# Patient Record
Sex: Female | Born: 1965 | Race: Black or African American | Hispanic: No | Marital: Married | Smoking: Former smoker
Health system: Southern US, Community
[De-identification: ages and names within clinical notes are randomized; demographics above are authoritative.]

## PROBLEM LIST (undated history)

## (undated) DIAGNOSIS — T7840XA Allergy, unspecified, initial encounter: Secondary | ICD-10-CM

## (undated) DIAGNOSIS — R011 Cardiac murmur, unspecified: Secondary | ICD-10-CM

## (undated) DIAGNOSIS — M1990 Unspecified osteoarthritis, unspecified site: Secondary | ICD-10-CM

## (undated) DIAGNOSIS — J45909 Unspecified asthma, uncomplicated: Secondary | ICD-10-CM

## (undated) DIAGNOSIS — F419 Anxiety disorder, unspecified: Secondary | ICD-10-CM

## (undated) DIAGNOSIS — F329 Major depressive disorder, single episode, unspecified: Secondary | ICD-10-CM

## (undated) DIAGNOSIS — Z8489 Family history of other specified conditions: Secondary | ICD-10-CM

## (undated) DIAGNOSIS — F32A Depression, unspecified: Secondary | ICD-10-CM

## (undated) DIAGNOSIS — I341 Nonrheumatic mitral (valve) prolapse: Secondary | ICD-10-CM

## (undated) DIAGNOSIS — R002 Palpitations: Secondary | ICD-10-CM

## (undated) DIAGNOSIS — K219 Gastro-esophageal reflux disease without esophagitis: Secondary | ICD-10-CM

## (undated) HISTORY — DX: Unspecified asthma, uncomplicated: J45.909

## (undated) HISTORY — DX: Cardiac murmur, unspecified: R01.1

## (undated) HISTORY — DX: Palpitations: R00.2

## (undated) HISTORY — DX: Anxiety disorder, unspecified: F41.9

## (undated) HISTORY — DX: Gastro-esophageal reflux disease without esophagitis: K21.9

## (undated) HISTORY — DX: Depression, unspecified: F32.A

## (undated) HISTORY — PX: ABDOMINAL HYSTERECTOMY: SHX81

## (undated) HISTORY — DX: Major depressive disorder, single episode, unspecified: F32.9

## (undated) HISTORY — DX: Nonrheumatic mitral (valve) prolapse: I34.1

## (undated) HISTORY — PX: PILONIDAL CYST EXCISION: SHX744

## (undated) HISTORY — DX: Unspecified osteoarthritis, unspecified site: M19.90

## (undated) HISTORY — DX: Allergy, unspecified, initial encounter: T78.40XA

## (undated) HISTORY — PX: OTHER SURGICAL HISTORY: SHX169

---

## 2016-05-01 ENCOUNTER — Emergency Department (HOSPITAL_COMMUNITY): Payer: BC Managed Care – PPO

## 2016-05-01 ENCOUNTER — Encounter (HOSPITAL_COMMUNITY): Payer: Self-pay | Admitting: Emergency Medicine

## 2016-05-01 ENCOUNTER — Emergency Department (HOSPITAL_COMMUNITY)
Admission: EM | Admit: 2016-05-01 | Discharge: 2016-05-01 | Disposition: A | Discharge status: Home / Self Care | Payer: BC Managed Care – PPO | Attending: Emergency Medicine | Admitting: Emergency Medicine

## 2016-05-01 DIAGNOSIS — Y999 Unspecified external cause status: Secondary | ICD-10-CM | POA: Insufficient documentation

## 2016-05-01 DIAGNOSIS — S0081XA Abrasion of other part of head, initial encounter: Secondary | ICD-10-CM | POA: Diagnosis not present

## 2016-05-01 DIAGNOSIS — S0990XA Unspecified injury of head, initial encounter: Secondary | ICD-10-CM | POA: Diagnosis present

## 2016-05-01 DIAGNOSIS — Z87891 Personal history of nicotine dependence: Secondary | ICD-10-CM | POA: Insufficient documentation

## 2016-05-01 DIAGNOSIS — W01198A Fall on same level from slipping, tripping and stumbling with subsequent striking against other object, initial encounter: Secondary | ICD-10-CM | POA: Insufficient documentation

## 2016-05-01 DIAGNOSIS — S40212A Abrasion of left shoulder, initial encounter: Secondary | ICD-10-CM | POA: Diagnosis not present

## 2016-05-01 DIAGNOSIS — S060X0A Concussion without loss of consciousness, initial encounter: Secondary | ICD-10-CM | POA: Insufficient documentation

## 2016-05-01 DIAGNOSIS — Y939 Activity, unspecified: Secondary | ICD-10-CM | POA: Insufficient documentation

## 2016-05-01 DIAGNOSIS — Z79899 Other long term (current) drug therapy: Secondary | ICD-10-CM | POA: Diagnosis not present

## 2016-05-01 DIAGNOSIS — Y92834 Zoological garden (Zoo) as the place of occurrence of the external cause: Secondary | ICD-10-CM | POA: Insufficient documentation

## 2016-05-01 NOTE — Discharge Instructions (Signed)
Your CT head does not show serious head injury. Your symptoms are suggestive of concussion. Take ibuprofen and tylenol for pain. Follow-up with PCP closely.  Rest your brain for concussive symptoms, such as avoiding prolonged computer/iphone/TV usage or activities requiring a significant amount of concentration until symptoms improve.

## 2016-05-01 NOTE — ED Provider Notes (Signed)
[facility] DEPT Provider Note   CSN: [id number] Arrival date & time: 05/01/16  1019     History   Chief Complaint Chief Complaint  Patient presents with  . Fall  . Head Injury    HPI Avayah Raffety is a 51 y.o. female.  The history is provided by the patient.  Fall  This is a new problem. The current episode started yesterday. Episode frequency: just once. The problem has been gradually worsening. Associated symptoms include headaches. Pertinent negatives include no chest pain, no abdominal pain and no shortness of breath. Nothing aggravates the symptoms. Nothing relieves the symptoms. She has tried ASA for the symptoms. The treatment provided no relief.   51 year old female who presents after mechanical fall with head injury. She is otherwise healthy and not anticoagulated. States that she tripped and fell onto the left side of her [place]. She did not have loss of consciousness or headache immediately after her injuries and felt okay. He did sustain multiple abrasions to the left shoulder and extremities for which she took ibuprofen for, to good effect. States that she was up all night because she was nervous about potential head injury. She developed right-sided headache and tenderness over the left side of her scalp starting at 3:57 AM in the morning. States that throughout this morning has been feeling "foggy" and nauseous. Denies any vision or speech changes, confusion, difficulty walking, focal numbness or weakness, or dizziness. Complains of musculoskeletal soreness throughout her body from her fall, but has been ambulating and moving everything normally. No chest pain, difficulty breathing, abdominal pain, nausea or vomiting. History reviewed. No pertinent past medical history.  There are no active problems to display for this patient.   Past Surgical History:  Procedure Laterality Date  . ABDOMINAL HYSTERECTOMY    . left  oopharectomy      OB History    No data available       Home Medications    Prior to Admission medications   Medication Sig Start Date End Date Taking? Authorizing Provider  loratadine (CLARITIN) 10 MG tablet Take 10 mg by mouth daily as needed for allergies.    Yes Historical Provider, MD  metoprolol succinate (TOPROL-XL) 25 MG 24 hr tablet Take 25 mg by mouth at bedtime. 03/09/16  Yes Historical Provider, MD  sertraline (ZOLOFT) 25 MG tablet Take 25 mg by mouth at bedtime.   Yes Historical Provider, MD    Family History History reviewed. No pertinent family history.  Social History Social History  Substance Use Topics  . Smoking status: Former Research scientist (life sciences)  . Smokeless tobacco: Not on file  . Alcohol use Yes     Comment: occasional     Allergies   Patient has no known allergies.   Review of Systems Review of Systems  Constitutional: Negative for appetite change and fever.  Respiratory: Negative for shortness of breath.   Cardiovascular: Negative for chest pain.  Gastrointestinal: Negative for abdominal pain.  Genitourinary: Negative for difficulty urinating.  Skin: Positive for wound.  Allergic/Immunologic: Negative for immunocompromised state.  Neurological: Positive for headaches.  Hematological: Does not bruise/bleed easily.  Psychiatric/Behavioral: Negative for confusion.  All other systems reviewed and are negative.    Physical Exam Updated Vital Signs BP (!) 140/97 (BP Location: Left Arm)   Pulse 78   Temp 98 F (36.7 C) (Oral)   Resp 18   Physical Exam Physical Exam  Nursing note and vitals reviewed. Constitutional:  Well developed, well nourished, non-toxic, and in no acute distress Head: Normocephalic and atraumatic.  Mouth/Throat: Oropharynx is clear and moist.  Ears: normal bilateral TMs Neck: Normal range of motion. Neck supple. no midline cervical spine tenderness Cardiovascular: Normal rate and regular rhythm.   Pulmonary/Chest: Effort  normal and breath sounds normal.  Abdominal: Soft. There is no tenderness. There is no rebound and no guarding.  Musculoskeletal: Normal range of motion.  Skin: Skin is warm and dry. abrasions over left chin, left shoulder, lower extremities Psychiatric: Cooperative Neurological:  Alert, oriented to person, place, time, and situation. Memory grossly in tact. Fluent speech. No dysarthria or aphasia.  Cranial nerves: VF are full.  EOMI without nystagmus. No gaze deviation. Facial muscles symmetric with activation. Sensation to light touch over face in tact bilaterally. Hearing grossly in tact. Palate elevates symmetrically. Head turn and shoulder shrug are intact. Tongue midline.  Reflexes defered.  Muscle bulk and tone normal. No pronator drift. Moves all extremities symmetrically. Sensation to light touch is in tact throughout in bilateral upper and lower extremities. Coordination reveals no dysmetria with finger to nose. Gait is narrow-based and steady. Non-ataxic.    ED Treatments / Results  Labs (all labs ordered are listed, but only abnormal results are displayed) Labs Reviewed - No data to display  EKG  EKG Interpretation None       Radiology Ct Head Wo Contrast  Result Date: 05/01/2016 CLINICAL DATA:  Tripped and fell yesterday, struck head on concrete, no LOC, head pain, nausea today EXAM: CT HEAD WITHOUT CONTRAST TECHNIQUE: Contiguous axial images were obtained from the base of the skull through the vertex without intravenous contrast. COMPARISON:  None. FINDINGS: Brain: No intracranial hemorrhage, mass effect or midline shift. No acute cortical infarction. No hydrocephalus. No mass lesion is noted on this unenhanced scan. The gray and white-matter differentiation is preserved. Vascular: No hyperdense vessel or unexpected calcification. Skull: Normal. Negative for fracture or focal lesion. Sinuses/Orbits: No acute finding. Other: None. IMPRESSION: No acute intracranial  abnormality. No skull fracture is noted. No paranasal sinuses air-fluid levels. Electronically Signed   By: Lahoma Crocker M.D.   On: 05/01/2016 11:34    Procedures Procedures (including critical care time)  Medications Ordered in ED Medications - No data to display   Initial Impression / Assessment and Plan / ED Course  I have reviewed the triage vital signs and the nursing notes.  Pertinent labs & imaging results that were available during my care of the patient were reviewed by me and considered in my medical decision making (see chart for details).     Presents with persistent headache, nausea and foggy thinking after head injury yesterday. Neuro intact. Suspect concussive head injury. CT had visualized and shows no acute intracranial processes. Discussed supportive care management and close PCP follow-up. Strict return and follow-up instructions reviewed. She expressed understanding of all discharge instructions and felt comfortable with the plan of care.   Final Clinical Impressions(s) / ED Diagnoses   Final diagnoses:  Injury of head, initial encounter  Concussion without loss of consciousness, initial encounter    New Prescriptions New Prescriptions   No medications on file     Forde Dandy, MD 05/01/16 1200

## 2016-05-01 NOTE — ED Triage Notes (Signed)
Pt tripped and fell and struck left head and shoulder on concrete yesterday. No LOC. C/o sharp right head pain, left and posterior head throbbing, disorientation onset about 30 minutes after injury, nausea onset today. Pt stayed up for most of night.  No vision changes, dizziness, slurred speech, ataxia, weakness.

## 2016-12-31 ENCOUNTER — Other Ambulatory Visit: Payer: Self-pay | Admitting: Internal Medicine

## 2016-12-31 DIAGNOSIS — R2242 Localized swelling, mass and lump, left lower limb: Secondary | ICD-10-CM

## 2017-01-14 ENCOUNTER — Ambulatory Visit: Payer: BC Managed Care – PPO | Admitting: Neurology

## 2017-01-14 ENCOUNTER — Encounter: Payer: Self-pay | Admitting: Neurology

## 2017-01-14 ENCOUNTER — Encounter (INDEPENDENT_AMBULATORY_CARE_PROVIDER_SITE_OTHER): Payer: Self-pay

## 2017-01-14 VITALS — BP 132/88 | HR 87 | Ht 64.0 in | Wt 179.0 lb

## 2017-01-14 DIAGNOSIS — R519 Headache, unspecified: Secondary | ICD-10-CM

## 2017-01-14 DIAGNOSIS — R0683 Snoring: Secondary | ICD-10-CM | POA: Diagnosis not present

## 2017-01-14 DIAGNOSIS — G4719 Other hypersomnia: Secondary | ICD-10-CM

## 2017-01-14 DIAGNOSIS — G478 Other sleep disorders: Secondary | ICD-10-CM

## 2017-01-14 DIAGNOSIS — I341 Nonrheumatic mitral (valve) prolapse: Secondary | ICD-10-CM

## 2017-01-14 DIAGNOSIS — R51 Headache: Secondary | ICD-10-CM

## 2017-01-14 DIAGNOSIS — R002 Palpitations: Secondary | ICD-10-CM | POA: Diagnosis not present

## 2017-01-14 DIAGNOSIS — I493 Ventricular premature depolarization: Secondary | ICD-10-CM

## 2017-01-14 NOTE — Progress Notes (Signed)
Subjective:    Patient ID: Rachel Stokes is a 52 y.o. female.  HPI     Star Age, MD, PhD [facility] [address] [place], [place] [address]  Dear Ulice Dash,   I saw your patient, Rachel Stokes, upon your kind request in my neurologic clinic today for initial consultation of her sleep disorder, in particular, concern for underlying obstructive sleep apnea. The patient is unaccompanied today. As you know, Rachel Stokes is a 52 year old left-handed woman with an underlying medical history of asthma, palpitations, mitral valve prolapse, PVCs, allergies, depression, and obesity, who reports snoring and excessive daytime somnolence. I reviewed your office note from 11/27/2016, which you kindly included. Her Epworth sleepiness score is 10 out of 24 today, fatigue score is 42 out of 63. She does not wake up rested. She is married and lives with her husband. She quit smoking in 2000, but was an infrequent/social smoker only. She drinks alcohol occasionally, caffeine infrequently, maybe once or twice a week. She has always been a night owl, her children tend to be like that as well. Her bedtime is around 11:30 but sometimes she is not asleep until 1 or 2 AM. Sometimes she sleeps better on the couch then in her bedroom. She does not have night to night nocturia but has had morning headaches. Her husband has sleep apnea and uses a CPAP machine. She denies telltale symptoms of restless leg syndrome. She is not aware of any family history of OSA. Wakeup time is between 8 and 9. She is a Land at [employer]. She has a 15 year old son and 30 year old daughter. Her daughter is home schooled.   Her Past Medical History Is Significant For: Past Medical History:  Diagnosis Date  . MVP (mitral valve prolapse)   . Palpitations     Her Past Surgical History Is Significant For: Past Surgical History:   Procedure Laterality Date  . ABDOMINAL HYSTERECTOMY    . left oopharectomy      Her Family History Is Significant For: No family history on file.  Her Social History Is Significant For: Social History   Socioeconomic History  . Marital status: Married    Spouse name: None  . Number of children: None  . Years of education: None  . Highest education level: None  Social Needs  . Financial resource strain: None  . Food insecurity - worry: None  . Food insecurity - inability: None  . Transportation needs - medical: None  . Transportation needs - non-medical: None  Occupational History  . None  Tobacco Use  . Smoking status: Former Research scientist (life sciences)  . Smokeless tobacco: Never Used  Substance and Sexual Activity  . Alcohol use: Yes    Comment: occasional  . Drug use: No  . Sexual activity: None  Other Topics Concern  . None  Social History Narrative  . None    Her Allergies Are:  No Known Allergies:   Her Current Medications Are:  Outpatient Encounter Medications as of 01/14/2017  Medication Sig  . loratadine (CLARITIN) 10 MG tablet Take 10 mg by mouth daily as needed for allergies.   . metoprolol succinate (TOPROL-XL) 25 MG 24 hr tablet Take 25 mg by mouth at bedtime.  . sertraline (ZOLOFT) 50 MG tablet Take 50 mg by mouth at bedtime.  . [DISCONTINUED] sertraline (ZOLOFT) 25 MG tablet Take 25 mg by mouth at bedtime.   No facility-administered encounter medications on file as of  01/14/2017.   :  Review of Systems:  Out of a complete 14 point review of systems, all are reviewed and negative with the exception of these symptoms as listed below: Review of Systems  Neurological:       Pt presents today to discuss her sleep. Pt has never had a sleep study but does endorse snoring.  Epworth Sleepiness Scale 0= would never doze 1= slight chance of dozing 2= moderate chance of dozing 3= high chance of dozing  Sitting and reading: 1 Watching TV: 3 Sitting inactive in a public  place (ex. Theater or meeting): 1 As a passenger in a car for an hour without a break: 3 Lying down to rest in the afternoon: 1 Sitting and talking to someone: 0 Sitting quietly after lunch (no alcohol): 1 In a car, while stopped in traffic: 0 Total: 10     Objective:  Neurological Exam  Physical Exam Physical Examination:   Vitals:   01/14/17 1336  BP: 132/88  Pulse: 87    General Examination: The patient is a very pleasant 52 y.o. female in no acute distress. She appears well-developed and well-nourished and well groomed.   HEENT: Normocephalic, atraumatic, pupils are equal, round and reactive to light and accommodation. disc margins noted. Extraocular tracking is good without limitation to gaze excursion or nystagmus noted. Normal smooth pursuit is noted. Hearing is grossly intact. Tympanic membranes are clear bilaterally. Face is symmetric with normal facial animation and normal facial sensation. Speech is clear with no dysarthria noted. There is no hypophonia. There is no lip, neck/head, jaw or voice tremor. Neck is supple with full range of passive and active motion. Oropharynx exam reveals: mild mouth dryness, good dental hygiene and mild airway crowding, due to tonsils in place, about 1+. Mallampati is class II. Tongue protrudes centrally and palate elevates symmetrically. Neck size is 16 inches. She has a Mild overbite. Nasal inspection reveals no significant nasal mucosal bogginess or redness and no septal deviation.   Chest: Clear to auscultation without wheezing, rhonchi or crackles noted.  Heart: S1+S2+0, regular and normal without murmurs, rubs or gallops noted.   Abdomen: Soft, non-tender and non-distended with normal bowel sounds appreciated on auscultation.  Extremities: There is no pitting edema in the distal lower extremities bilaterally. Pedal pulses are intact.  Skin: Warm and dry without trophic changes noted.  Musculoskeletal: exam reveals no obvious joint  deformities, tenderness or joint swelling or erythema.   Neurologically:  Mental status: The patient is awake, alert and oriented in all 4 spheres. Her immediate and remote memory, attention, language skills and fund of knowledge are appropriate. There is no evidence of aphasia, agnosia, apraxia or anomia. Speech is clear with normal prosody and enunciation. Thought process is linear. Mood is normal and affect is normal.  Cranial nerves II - XII are as described above under HEENT exam. In addition: shoulder shrug is normal with equal shoulder height noted. Motor exam: Normal bulk, strength and tone is noted. There is no drift, tremor or rebound. Romberg is negative. Reflexes are 2+ throughout. Fine motor skills and coordination: intact with normal finger taps, normal hand movements, normal rapid alternating patting, normal foot taps and normal foot agility.  Cerebellar testing: No dysmetria or intention tremor on finger to nose testing. Heel to shin is unremarkable bilaterally. There is no truncal or gait ataxia.  Sensory exam: intact to light touch and  proprioception in the upper and lower extremities.  Gait, station and balance: She stands  easily. No veering to one side is noted. No leaning to one side is noted. Posture is age-appropriate and stance is narrow based. Gait shows normal stride length and normal pace. No problems turning are noted.   Assessment and Plan:  In summary, Darletta Noblett is a very pleasant 52 y.o.-year old female with an underlying medical history of asthma, palpitations, mitral valve prolapse, PVCs, allergies, depression, and obesity, whose history and physical exam are concerning for obstructive sleep apnea (OSA). I had a long chat with the patient about my findings and the diagnosis of OSA, its prognosis and treatment options. We talked about medical treatments, surgical interventions and non-pharmacological approaches. I explained in particular the risks and  ramifications of untreated moderate to severe OSA, especially with respect to developing cardiovascular disease down the Road, including congestive heart failure, difficult to treat hypertension, cardiac arrhythmias, or stroke. Even type 2 diabetes has, in part, been linked to untreated OSA. Symptoms of untreated OSA include daytime sleepiness, memory problems, mood irritability and mood disorder such as depression and anxiety, lack of energy, as well as recurrent headaches, especially morning headaches. We talked about trying to maintain a healthy lifestyle in general, as well as the importance of weight control. I encouraged the patient to eat healthy, exercise daily and keep well hydrated, to keep a scheduled bedtime and wake time routine, to not skip any meals and eat healthy snacks in between meals. I advised the patient not to drive when feeling sleepy. I recommended the following at this time: sleep study with potential positive airway pressure titration. (We will score hypopneas at 3%).   I explained the sleep test procedure to the patient. She  may be a candidate for CPAP therapy down the road. I will see her back after sleep study testing is completed.  I answered all her questions today and the patient was in agreement. Thank you very much for allowing me to participate in the care of this nice patient. If I can be of any further assistance to you please do not hesitate to call me at [phone].  Sincerely,   Star Age, MD, PhD

## 2017-01-14 NOTE — Patient Instructions (Addendum)

## 2017-01-27 ENCOUNTER — Telehealth: Payer: Self-pay | Admitting: Neurology

## 2017-01-27 NOTE — Telephone Encounter (Signed)
We have attempted to call the patient two times to schedule sleep study. Patient has been unavailable at the phone numbers we have on file, and has not returned our calls. At this time, we will send a letter asking patient to please contact the sleep lab.  °

## 2017-02-03 ENCOUNTER — Encounter: Payer: Self-pay | Admitting: Internal Medicine

## 2017-04-20 ENCOUNTER — Other Ambulatory Visit: Payer: Self-pay | Admitting: Registered Nurse

## 2017-04-20 DIAGNOSIS — R2230 Localized swelling, mass and lump, unspecified upper limb: Secondary | ICD-10-CM

## 2017-04-24 ENCOUNTER — Ambulatory Visit
Admission: RE | Admit: 2017-04-24 | Discharge: 2017-04-24 | Disposition: A | Discharge status: Home / Self Care | Payer: BC Managed Care – PPO | Source: Ambulatory Visit | Attending: Registered Nurse | Admitting: Registered Nurse

## 2017-04-24 DIAGNOSIS — R2230 Localized swelling, mass and lump, unspecified upper limb: Secondary | ICD-10-CM

## 2018-03-05 ENCOUNTER — Encounter: Payer: Self-pay | Admitting: Internal Medicine

## 2018-03-11 ENCOUNTER — Encounter: Payer: Self-pay | Admitting: Internal Medicine

## 2018-03-11 ENCOUNTER — Ambulatory Visit (AMBULATORY_SURGERY_CENTER): Payer: Self-pay

## 2018-03-11 VITALS — Ht 64.5 in | Wt 179.0 lb

## 2018-03-11 DIAGNOSIS — Z1211 Encounter for screening for malignant neoplasm of colon: Secondary | ICD-10-CM

## 2018-03-11 MED ORDER — NA SULFATE-K SULFATE-MG SULF 17.5-3.13-1.6 GM/177ML PO SOLN: 1.0000 | Freq: Once | ORAL | 0 refills | Status: AC

## 2018-03-11 NOTE — Progress Notes (Signed)
Denies allergies to eggs or soy products. Denies complication of anesthesia or sedation. Denies use of weight loss medication. Denies use of O2.   Emmi instructions given to the patient.   A 15.00 coupon for Suprep was given to the patient.

## 2018-03-22 ENCOUNTER — Encounter: Payer: BC Managed Care – PPO | Admitting: Internal Medicine

## 2018-03-26 ENCOUNTER — Other Ambulatory Visit: Payer: Self-pay

## 2018-03-26 MED ORDER — METOPROLOL SUCCINATE ER 25 MG PO TB24: 25.0000 mg | ORAL_TABLET | Freq: Every day | ORAL | 1 refills | Status: DC

## 2018-06-28 IMAGING — CT CT HEAD W/O CM
3 of 4 series · 14 of 47 positions shown, 16 images · non-contrast
Comparison: None.

CLINICAL DATA: Tripped and fell yesterday, struck head on concrete,
no LOC, head pain, nausea today

EXAM:
CT HEAD WITHOUT CONTRAST
TECHNIQUE: Contiguous axial images were obtained from the base of the skull
through the vertex without intravenous contrast.

[Series 2: head w/o · axial · non-contrast · 0.46mm/px · z∈[-95,+25]mm · 8 of 30 slices shown, 10 images]
[im 3/30  brain]
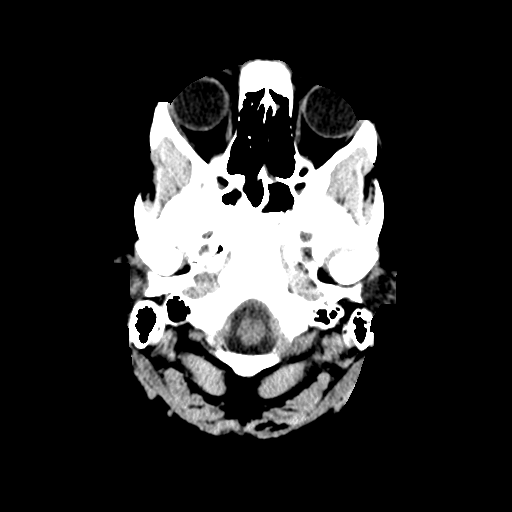
[im 3/30  bone]
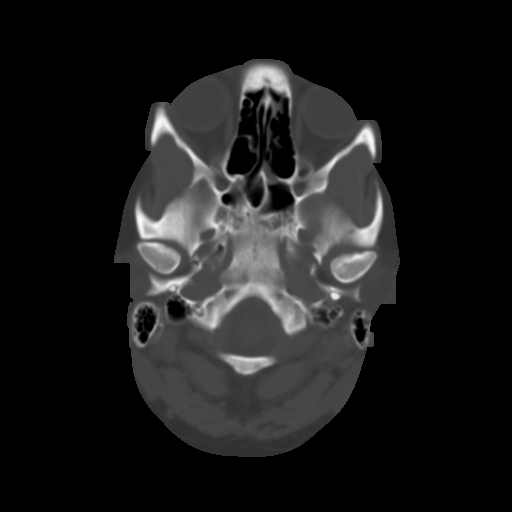
[im 6/30  brain]
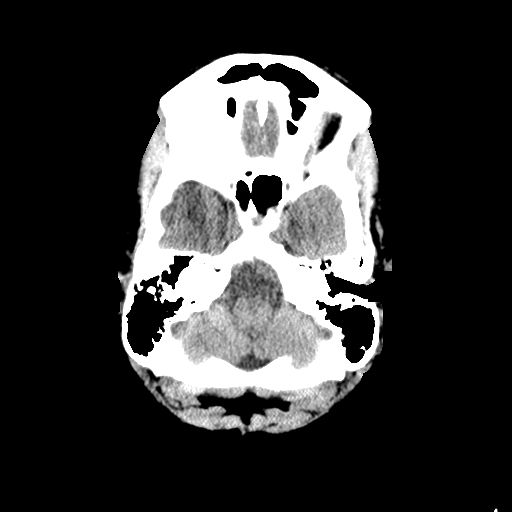
[im 9/30  brain]
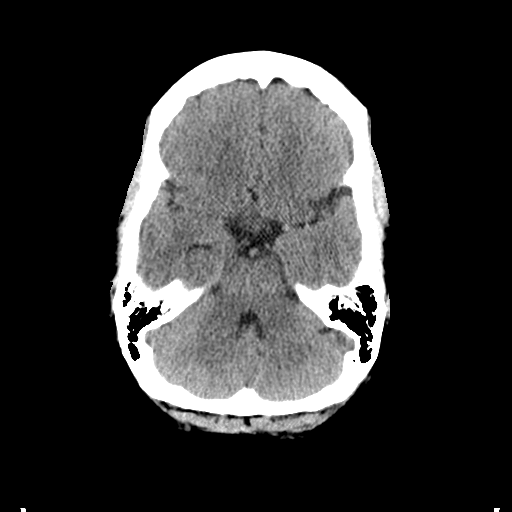
[im 12/30  brain]
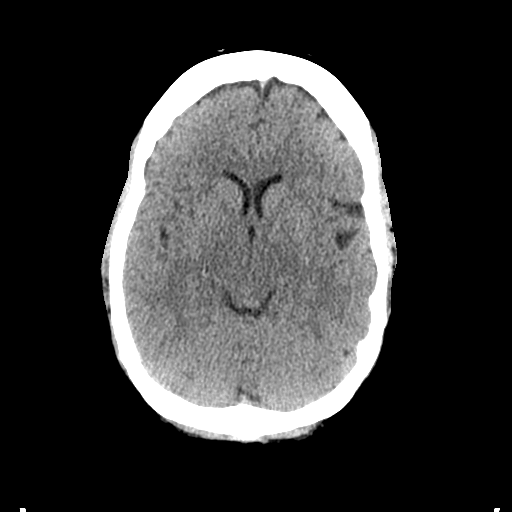
[im 18/30  brain]
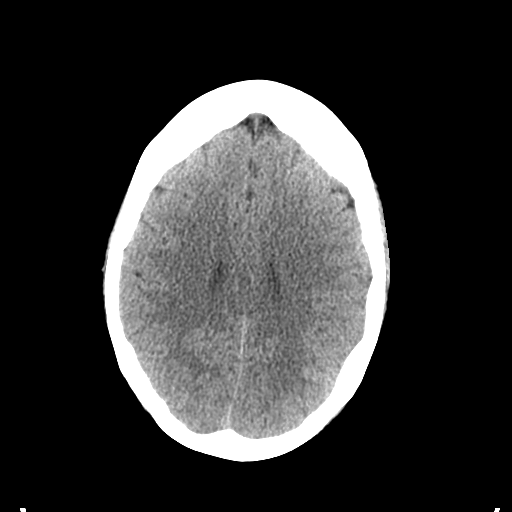
[im 18/30  bone]
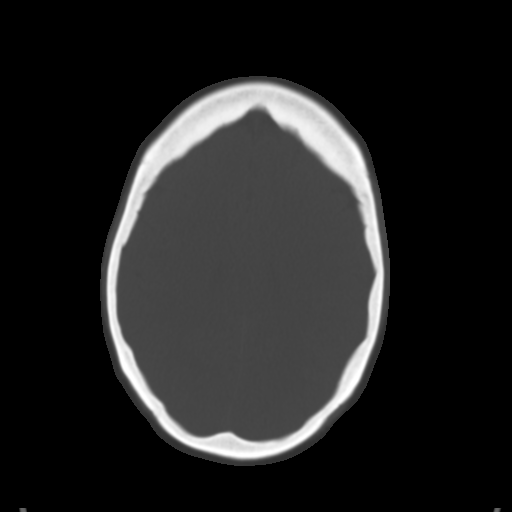
[im 21/30  brain]
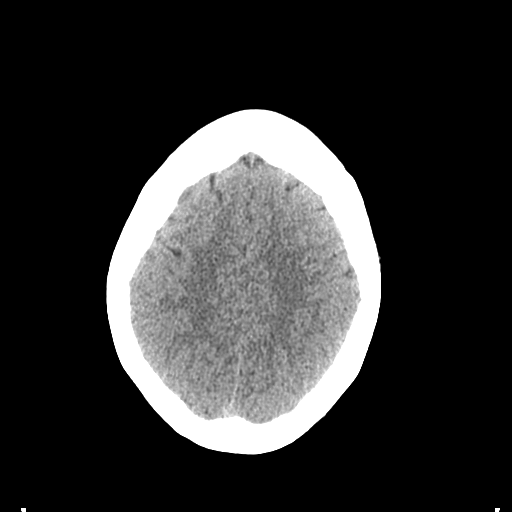
[im 24/30  brain]
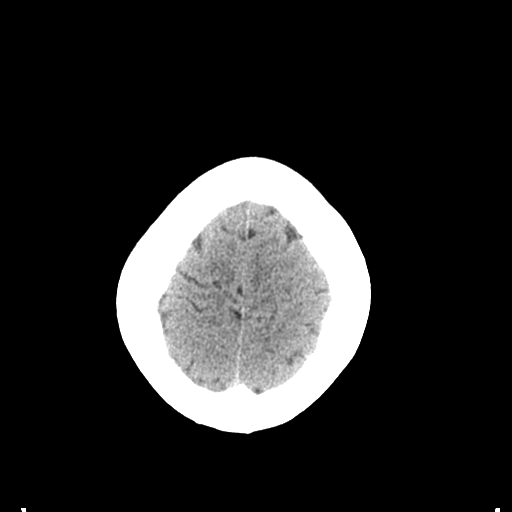
[im 27/30  brain]
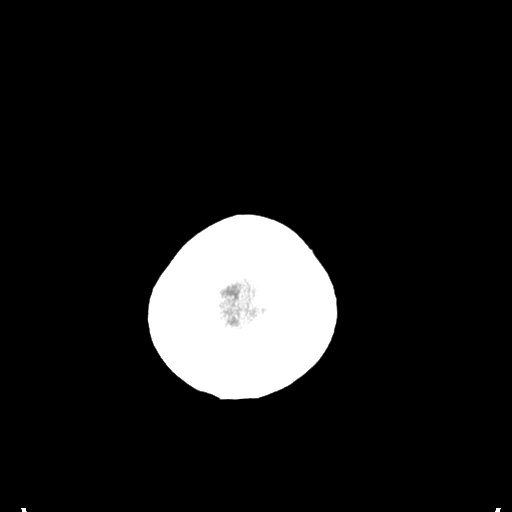

[Series 4: coronal · coronal · 0.29mm/px · 3 of 77 slices shown]
[im 26/77  brain]
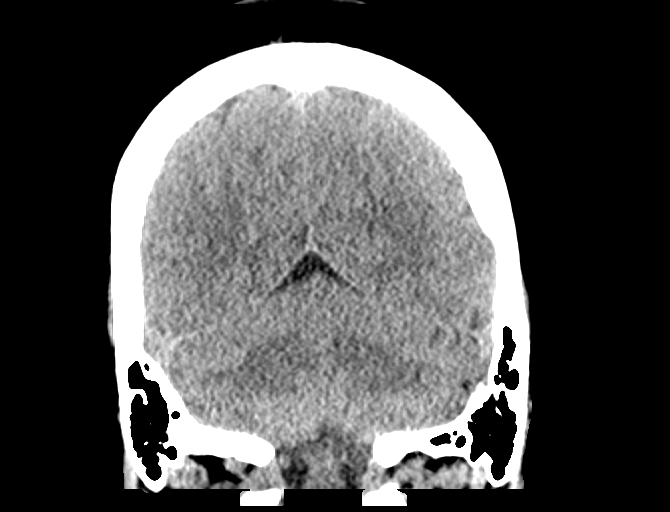
[im 34/77  brain]
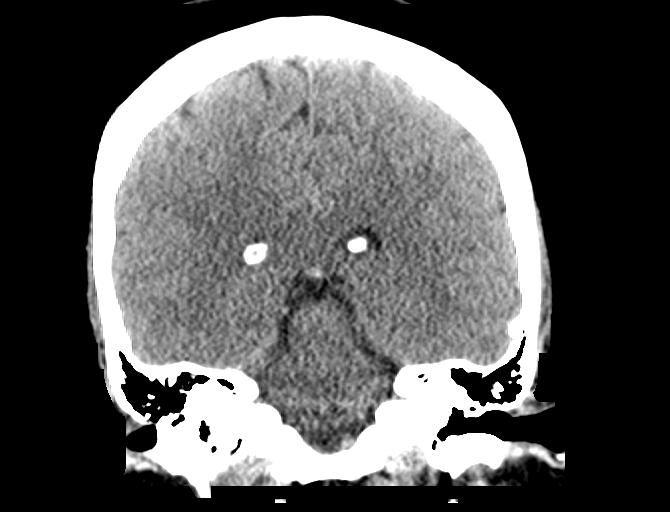
[im 43/77  brain]
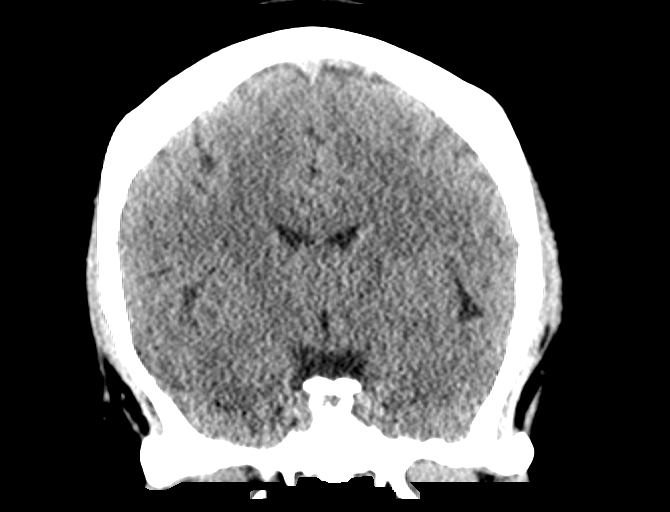

[Series 5: sagittal · sagittal · 0.31mm/px · 3 of 62 slices shown]
[im 21/62  brain]
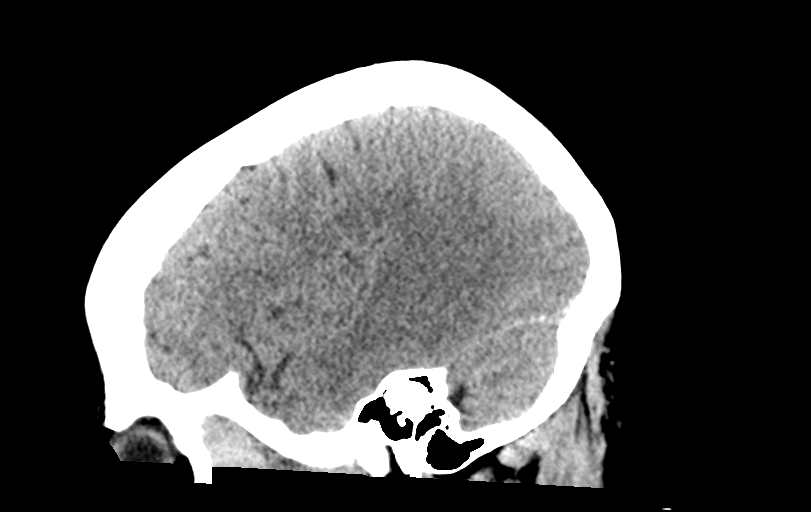
[im 31/62  brain]
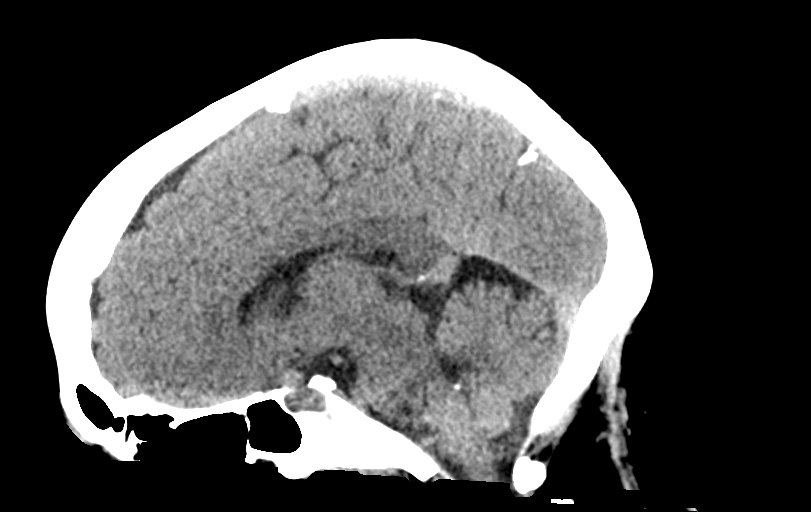
[im 41/62  brain]
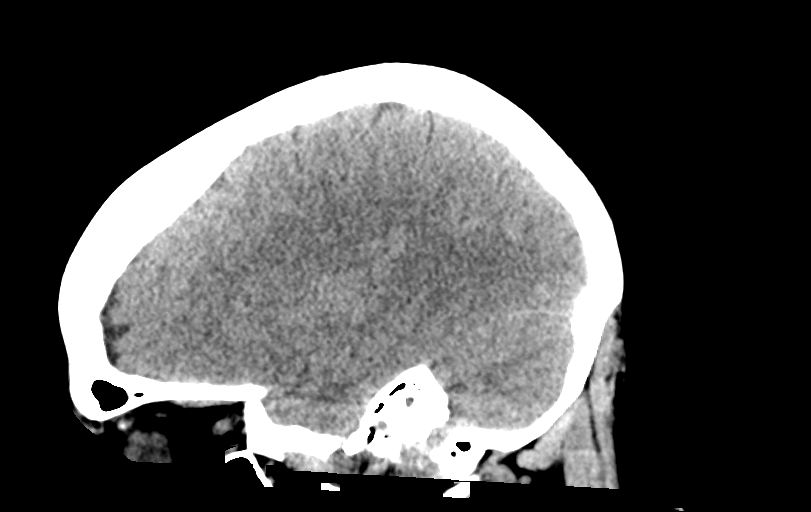

[14 of 47 positions shown; findings below may reference images not displayed]

FINDINGS: Brain: No intracranial hemorrhage, mass effect or midline shift. No
acute cortical infarction. No hydrocephalus. No mass lesion is noted
on this unenhanced scan. The gray and white-matter differentiation
is preserved.

Vascular: No hyperdense vessel or unexpected calcification.

Skull: Normal. Negative for fracture or focal lesion.

Sinuses/Orbits: No acute finding.

Other: None.
IMPRESSION: No acute intracranial abnormality. No skull fracture is noted. No
paranasal sinuses air-fluid levels.

## 2018-07-23 HISTORY — PX: ROTATOR CUFF REPAIR: SHX139

## 2018-12-30 ENCOUNTER — Encounter: Payer: Self-pay | Admitting: Cardiology

## 2018-12-30 ENCOUNTER — Other Ambulatory Visit: Payer: Self-pay

## 2018-12-30 ENCOUNTER — Ambulatory Visit (INDEPENDENT_AMBULATORY_CARE_PROVIDER_SITE_OTHER): Payer: BC Managed Care – PPO | Admitting: Cardiology

## 2018-12-30 VITALS — BP 123/88 | HR 88 | Temp 97.3°F | Ht 62.5 in | Wt 186.0 lb

## 2018-12-30 DIAGNOSIS — I341 Nonrheumatic mitral (valve) prolapse: Secondary | ICD-10-CM

## 2018-12-30 DIAGNOSIS — R002 Palpitations: Secondary | ICD-10-CM | POA: Diagnosis not present

## 2018-12-30 NOTE — Progress Notes (Signed)
Primary Physician/Referring:  Velna Hatchet, MD  Patient ID: Rachel Stokes, female    DOB: Jul 16, 1965, 53 y.o.   MRN: [id number]  Chief Complaint  Patient presents with  . Palpitations    FOLLOW UP  . Mitral Valve Prolapse   HPI:    Rachel Stokes  is a 53 y.o. 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 She has 2 varieties of palpitations, first one being occasional skipped beats that she has had all her life, lasting a second or two, but she also has a second variety in which she has rapid palpitations but again lasting 10-20 seconds, with no other associated symptoms. There is no history of premature coronary artery disease or sudden cardiac death. She does have history of bronchial asthma.    She is tolerating low-dose of metoprolol with no recurrence of palpitations and she is presently feeling the best she has in a while.  No chest pain, no leg edema, no worsening dyspnea and she has not had recent asthma exacerbation.  This is her one year visit.  Past Medical History:  Diagnosis Date  . Allergy   . Anxiety   . Arthritis   . Asthma   . Depression   . GERD (gastroesophageal reflux disease)   . Heart murmur   . MVP (mitral valve prolapse)   . Palpitations    Past Surgical History:  Procedure Laterality Date  . ABDOMINAL HYSTERECTOMY    . CESAREAN SECTION  2007  . left oopharectomy    . PILONIDAL CYST EXCISION    . ROTATOR CUFF REPAIR Right 07/23/2018   Social History   Socioeconomic History  . Marital status: Married    Spouse name: Not on file  . Number of children: Not on file  . Years of education: Not on file  . Highest education level: Not on file  Occupational History  . Not on file  Tobacco Use  . Smoking status: Former Research scientist (life sciences)  . Smokeless tobacco: Never Used  Substance and Sexual Activity  . Alcohol use: Yes    Comment: occasional  . Drug  use: No  . Sexual activity: Not on file  Other Topics Concern  . Not on file  Social History Narrative  . Not on file   Social Determinants of Health   Financial Resource Strain:   . Difficulty of Paying Living Expenses: Not on file  Food Insecurity:   . Worried About Charity fundraiser in the Last Year: Not on file  . Ran Out of Food in the Last Year: Not on file  Transportation Needs:   . Lack of Transportation (Medical): Not on file  . Lack of Transportation (Non-Medical): Not on file  Physical Activity:   . Days of Exercise per Week: Not on file  . Minutes of Exercise per Session: Not on file  Stress:   . Feeling of Stress : Not on file  Social Connections:   . Frequency of Communication with Friends and Family: Not on file  . Frequency of Social Gatherings with Friends and Family: Not on file  . Attends Religious Services: Not on file  . Active Member of Clubs or Organizations: Not on file  . Attends Archivist Meetings: Not on file  . Marital Status: Not on file  Intimate Partner Violence:   . Fear of Current or Ex-Partner: Not on file  . Emotionally Abused: Not on file  .  Physically Abused: Not on file  . Sexually Abused: Not on file   ROS  Review of Systems  Constitution: Negative for chills, decreased appetite, malaise/fatigue and weight gain.  Cardiovascular: Negative for dyspnea on exertion, leg swelling and syncope.  Endocrine: Negative for cold intolerance.  Hematologic/Lymphatic: Does not bruise/bleed easily.  Musculoskeletal: Negative for joint swelling.  Gastrointestinal: Negative for abdominal pain, anorexia, change in bowel habit, hematochezia and melena.  Neurological: Negative for headaches and light-headedness.  Psychiatric/Behavioral: Negative for depression and substance abuse.  All other systems reviewed and are negative.  Objective  Blood pressure 123/88, pulse 88, temperature (!) 97.3 F (36.3 C), height 5' 2.5" (1.588 m), weight  186 lb (84.4 kg), SpO2 98 %.  Vitals with BMI 12/30/2018 03/11/2018 01/14/2017  Height 5' 2.5" 5' 4.5" '5\' 4"'   Weight 186 lbs 179 lbs 179 lbs  BMI 33.46 16.10 96.04  Systolic 540 - 981  Diastolic 88 - 88  Pulse 88 - 87     Physical Exam  Constitutional:  She is moderately built and mildly obese in no acute distress.  HENT:  Head: Atraumatic.  Eyes: Conjunctivae are normal.  Neck: No JVD present. No thyromegaly present.  Cardiovascular: Normal rate, regular rhythm, normal heart sounds and intact distal pulses. Exam reveals no gallop.  No murmur heard. No leg edema, no JVD.  Pulmonary/Chest: Effort normal and breath sounds normal.  Abdominal: Soft. Bowel sounds are normal.  Musculoskeletal:        General: Normal range of motion.     Cervical back: Neck supple.  Neurological: She is alert.  Skin: Skin is warm and dry.  Psychiatric: She has a normal mood and affect.   Laboratory examination:   No results for input(s): NA, K, CL, CO2, GLUCOSE, BUN, CREATININE, CALCIUM, GFRNONAA, GFRAA in the last 8760 hours. CrCl cannot be calculated (No successful lab value found.).  No flowsheet data found. No flowsheet data found. Lipid Panel  No results found for: CHOL, TRIG, HDL, CHOLHDL, VLDL, LDLCALC, LDLDIRECT HEMOGLOBIN A1C No results found for: HGBA1C, MPG TSH No results for input(s): TSH in the last 8760 hours.  Medications and allergies  No Known Allergies   Current Outpatient Medications  Medication Instructions  . BREO ELLIPTA 100-25 MCG/INH AEPB As needed  . ciclopirox (LOPROX) 0.77 % cream 1.91 application, Topical, Daily  . levalbuterol (XOPENEX) 0.63 mg, Nebulization, Every 4 hours PRN  . loratadine (CLARITIN) 10 mg, Oral, Daily PRN  . metoprolol succinate (TOPROL-XL) 25 mg, Oral, Daily at bedtime  . montelukast (SINGULAIR) 10 MG tablet As needed  . sertraline (ZOLOFT) 50 mg, Oral, Daily at bedtime    Radiology:  No results found.  Cardiac Studies:   Holter  Monitor 48 hours 11/01/2014: Symptomatic transmissions reveals artifact.  There were frequent PVCs, ventricular couplets and ventricular triplets and 2 episodes of 3 beats ventricle  tachycardia maximum heart rate of 133 bpm x 2.  Total of 17,500 PVCs in 48 hours.  Atrial tachycardia, duration 7 seconds at the rate of 150 bpm.  No symptoms were described at this time.  Treadmill Exercise stress 11/16/2014: Indication: LVOT tachycardia. Palpitations The patient exercised according to Bruce Protocol, Total time recorded  6:56 min achieving max heart rate of   174which was  101% of MPHR for age and  8.96 METS of work.  Normal BP response. There was no ST-T changes of ischemia with exercise stress test. Stress terminated due to THR (>85% MPHR)/MPHR met. Resting EKG NSR, Occasional PVC.  With stress there were  occasional PVC, which increased in recovery with brief V- Bigeminy. No other  complex arrhythmias noted. Normal BP response.   Echocardiogram 11/13/2016: Left ventricle cavity is normal in size. LVESD 3.5 cm Mild concentric hypertrophy of the left ventricle. Normal LVEF. Visual EF 91%  Normal diastolic function. Mild bileaflet prolapse with late systolic mild to moderate regurgitation. No significant change noted on this echocardiogram compared to prior study in 2016.  No evidence of pulmonary hypertension.  Assessment     ICD-10-CM   1. Palpitations  R00.2 EKG 12-Lead  2. Mitral valve prolapse  I34.1     EKG 12/30/2018: Normal sinus rhythm at rate of 80 bpm, normal axis, nonspecific T abnormality.no significant change since 12/28/2017.   Recommendations:  No orders of the defined types were placed in this encounter.   Rachel Stokes  is a  53 y.o.  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  She also has MVP with mild to moderate MR.  She is here on an  annual visit.  States her labs including TSH and lipids normal recently.   He has done well with low-dose of beta blocker therapy, although has bronchial asthma has not had any significant complications from the same.  In spite of very careful auscultation, I do not hear any murmur. In view of the fact that she has done well for the past 2 years, I'll see her back on a p.r.n. basis and she is advised to see me if she notices new onset dyspnea, worsening palpitations or if a murmur is well heard.  No endocarditis prophylaxis is indicated.   Adrian Prows, MD, [facility] 12/31/2018, [facility] Pager: [phone] Office: [phone]

## 2019-01-27 ENCOUNTER — Other Ambulatory Visit: Payer: Self-pay

## 2019-01-27 DIAGNOSIS — I341 Nonrheumatic mitral (valve) prolapse: Secondary | ICD-10-CM

## 2019-01-27 MED ORDER — METOPROLOL SUCCINATE ER 25 MG PO TB24: 25.0000 mg | ORAL_TABLET | Freq: Every day | ORAL | 1 refills | Status: AC

## 2019-03-05 NOTE — Progress Notes (Signed)
03/05/2019 TEHANI MERSMAN [id number] [birth year]   CHIEF COMPLAINT: Hemosure positive   HISTORY OF PRESENT ILLNESS:  Rachel Stokes is a 54 year old female with a past medical history of anxiety, depression, asthma, MVP, glaucoma and remote GERD. S/P hysterectomy and left oophorectomy 2015, C section 2007 and Rotator cuff repair in 2020. She was scheduled for a screening colonoscopy one year ago, however, she canceled this procedure due to the Covid 19 pandemic. She underwent a positive Hemosure test and she was referred to our office by Dr. Ardeth Perfect to schedule a colonoscopy.  She typically passes a formed yellow to brown stool once daily. However, today she passed a looser brown stool prior to her appointment. No recent antibiotic use. No rectal bleeding or melena. She reported having infrequent black stool on a few occasions 14 years ago. No history of PUD. She reported having reflux symptoms for 3 months after she took 2 doses of a pain medication for her shoulder pain 1 year ago, possibly was a NSAID. She denies having any further heartburn since then. No dysphagia. No upper or lower abdominal pain. No NSAID use. She complains of having chronic nausea for most of her life. No vomiting. Stomach feels "rumbly" once weekly. Foods such as beef, garlic and tomato sauce triggers her nausea. Maternal family with Ashkenazi heritage. No known family history of IBD or celiac disease. No family history of colorectal cancer.   She reports having a high pain threshold. Potentially requires higher doses of sedation/anesthesia. She required higher doses of epidural anesthesia at the time of her C section in 2007. No problems with anesthesia used during her right shoulder surgery 07/2018.   History of MVP and palpitations controlled on Metoprolol XL 52m QD. No current palpitations. No chest pain of SOB.   Labs 02/18/2019: Na 142. K 4.3. BUN 12. Cr. 0.8. Glu 97. Alk phos 78. AST 11. ALT  14. T. Bili 0.3. WBC 8.08. Hg 13.0. HCT 42.7.  PLT 194. TSH 0.71.   Echocardiogram 11/13/2016: Left ventricle cavity is normal in size. LVESD 3.5 cm Mild concentric hypertrophy of the left ventricle. Normal LVEF. Visual EF 588% Normal diastolic function. Mild bileaflet prolapse with late systolic mild to moderate regurgitation. No significant change noted on this echocardiogram compared to prior study in 2016.  No evidence of pulmonary hypertension.  Past Medical History:  Diagnosis Date  . Allergy   . Anxiety   . Arthritis   . Asthma   . Depression   . GERD (gastroesophageal reflux disease)   . Heart murmur   . MVP (mitral valve prolapse)   . Palpitations    Past Surgical History:  Procedure Laterality Date  . ABDOMINAL HYSTERECTOMY    . CESAREAN SECTION  2007  . left oopharectomy    . PILONIDAL CYST EXCISION    . ROTATOR CUFF REPAIR Right 07/23/2018    Family History: MGF pancreatic cancer. Paternal grandmother and paterna aunt with breast cancer.   Social History: Married. Two children. 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 Daughter 178with allergies. Past smoker quit 1999. She drinks one glass of wine every 2 months or less.  No drug use.    Outpatient Encounter Medications as of 03/07/2019  Medication Sig  . BREO ELLIPTA 100-25 MCG/INH AEPB as needed.  . ciclopirox (LOPROX) 0.77 % cream Apply 05.02application topically daily.  .Marland Kitchenlevalbuterol (XOPENEX) 0.63 MG/3ML nebulizer solution Take 0.63  mg by nebulization every 4 (four) hours as needed for wheezing or shortness of breath.  . loratadine (CLARITIN) 10 MG tablet Take 10 mg by mouth daily as needed for allergies.   . metoprolol succinate (TOPROL-XL) 25 MG 24 hr tablet Take 1 tablet (25 mg total) by mouth at bedtime.  . montelukast (SINGULAIR) 10 MG tablet as needed.  . sertraline (ZOLOFT) 50 MG tablet Take 50 mg by mouth at bedtime.   No facility-administered encounter  medications on file as of 03/07/2019.   No Known Allergies  REVIEW OF SYSTEMS: Gen: + night sweats. No fever. No weight loss.  CV: Denies chest pain, palpitations or edema. Resp: Denies cough, shortness of breath of hemoptysis.  GI: Denies heartburn, dysphagia, stomach or lower abdominal pain. No diarrhea or constipation.  GU : + frequent urination.  MS: Denies joint pain, muscles aches or weakness. Derm: Denies rash, itchiness, skin lesions or unhealing ulcers. Psych: + anxiety.  Heme: Denies bruising, bleeding. Neuro:  Denies headaches, dizziness or paresthesias. Endo:  Denies any problems with DM, thyroid or adrenal function.  PHYSICAL EXAM: BP 124/84 (BP Location: Left Arm, Patient Position: Sitting, Cuff Size: Normal)   Pulse 96   Temp 99 F (37.2 C)   Ht '5\' 4"'  (1.626 m) Comment: height measured without shoes  Wt 186 lb 4 oz (84.5 kg)   BMI 31.97 kg/m   General: Well developed 54 year old female in no acute distress. Head: Normocephalic and atraumatic. Eyes:  Sclerae non-icteric, conjunctive pink. Ears: Normal auditory acuity. Neck: Supple, no lymphadenopathy or thyromegaly.  Lungs: Clear bilaterally to auscultation without wheezes, crackles or rhonchi. Heart: Regular rate and rhythm. No murmur, rub or gallop appreciated.  Abdomen: Soft, nontender, non distended. No masses. No hepatosplenomegaly. Normoactive bowel sounds x 4 quadrants. Laparoscopic scars intact.  Rectal: Deferred.  Musculoskeletal: Symmetrical with no gross deformities. Skin: Warm and dry. No rash or lesions on visible extremities. Extremities: No edema. Neurological: Alert oriented x 4, no focal deficits.  Psychological:  Alert and cooperative. Normal mood and affect.  ASSESSMENT AND PLAN:  10. 54 year old female with + Hemosure presents today to schedule a colonoscopy. -Colonoscopy benefits and risks discussed including risk with sedation, risk of bleeding, perforation and infection  -Further follow  up to be determined after colonoscopy completed   2. Chronic nausea. GERD symptoms for 3 months post right shoulder surgery resolved.  -EGD to rule out gastritis, benefits and risks discussed including risk with sedation, risk of bleeding, perforation and infection  -If EGD negative, to consider scheduling and abdominal sonogram to assess the gallbladder  3. History of MVP with palpitations  followed by cardiologist, Dr. Einar Gip.       CC:  Velna Hatchet, MD

## 2019-03-07 ENCOUNTER — Ambulatory Visit (INDEPENDENT_AMBULATORY_CARE_PROVIDER_SITE_OTHER): Payer: BC Managed Care – PPO | Admitting: Nurse Practitioner

## 2019-03-07 ENCOUNTER — Encounter: Payer: Self-pay | Admitting: Nurse Practitioner

## 2019-03-07 ENCOUNTER — Other Ambulatory Visit: Payer: Self-pay

## 2019-03-07 VITALS — BP 124/84 | HR 96 | Temp 99.0°F | Ht 64.0 in | Wt 186.2 lb

## 2019-03-07 DIAGNOSIS — R110 Nausea: Secondary | ICD-10-CM | POA: Diagnosis not present

## 2019-03-07 DIAGNOSIS — R195 Other fecal abnormalities: Secondary | ICD-10-CM | POA: Diagnosis not present

## 2019-03-07 DIAGNOSIS — R143 Flatulence: Secondary | ICD-10-CM | POA: Diagnosis not present

## 2019-03-07 DIAGNOSIS — R141 Gas pain: Secondary | ICD-10-CM

## 2019-03-07 DIAGNOSIS — Z01818 Encounter for other preprocedural examination: Secondary | ICD-10-CM

## 2019-03-07 MED ORDER — NA SULFATE-K SULFATE-MG SULF 17.5-3.13-1.6 GM/177ML PO SOLN: 1.0000 | Freq: Once | ORAL | 0 refills | Status: AC

## 2019-03-07 NOTE — Progress Notes (Signed)
Reviewed and agree with management plan.  Lizbeth Feijoo T. Miyoko Hashimi, MD FACG [facility]  

## 2019-03-07 NOTE — Patient Instructions (Addendum)
If you are age 53 or older, your body mass index should be between 23-30. Your Body mass index is 31.97 kg/m. If this is out of the aforementioned range listed, please consider follow up with your Primary Care Provider.  If you are age 10 or younger, your body mass index should be between 19-25. Your Body mass index is 31.97 kg/m. If this is out of the aformentioned range listed, please consider follow up with your Primary Care Provider.    We have sent the following medications to your pharmacy for you to pick up at your convenience:  Suprep Bowel Prep  Please use a probiotic of choice  Due to recent changes in healthcare laws, you may see the results of your imaging and laboratory studies on MyChart before your provider has had a chance to review them.  We understand that in some cases there may be results that are confusing or concerning to you. Not all laboratory results come back in the same time frame and the provider may be waiting for multiple results in order to interpret others.  Please give [place] 48 hours in order for your provider to thoroughly review all the results before contacting the office for clarification of your results.   Thank you for choosing [facility] Noralyn Pick, CRNP

## 2019-03-08 ENCOUNTER — Telehealth: Payer: Self-pay | Admitting: Nurse Practitioner

## 2019-03-08 MED ORDER — CLENPIQ 10-3.5-12 MG-GM -GM/160ML PO SOLN: 1.0000 | ORAL | 0 refills | Status: DC

## 2019-03-08 NOTE — Telephone Encounter (Signed)
Contacted the patient and advised her that I would be changing her prep to Clenpiq and completing a set of new instructions. She is able to receive them through my chart. Patient verbalized understanding.

## 2019-03-08 NOTE — Telephone Encounter (Signed)
[facility] pharmacy called and requested Suprep rx be changed to Clenpiq.

## 2019-03-08 NOTE — Telephone Encounter (Signed)
Contacted [facility] and spoke with Rachel Stokes is covered at 100%

## 2019-03-18 ENCOUNTER — Encounter: Payer: Self-pay | Admitting: Gastroenterology

## 2019-03-18 ENCOUNTER — Other Ambulatory Visit: Payer: Self-pay | Admitting: Gastroenterology

## 2019-03-18 ENCOUNTER — Ambulatory Visit (INDEPENDENT_AMBULATORY_CARE_PROVIDER_SITE_OTHER): Payer: BC Managed Care – PPO

## 2019-03-18 DIAGNOSIS — Z1159 Encounter for screening for other viral diseases: Secondary | ICD-10-CM

## 2019-03-19 LAB — SARS CORONAVIRUS 2 (TAT 6-24 HRS): SARS Coronavirus 2: NEGATIVE

## 2019-03-22 ENCOUNTER — Ambulatory Visit (AMBULATORY_SURGERY_CENTER): Payer: BC Managed Care – PPO | Admitting: Gastroenterology

## 2019-03-22 ENCOUNTER — Encounter: Payer: Self-pay | Admitting: Gastroenterology

## 2019-03-22 ENCOUNTER — Other Ambulatory Visit: Payer: Self-pay

## 2019-03-22 VITALS — BP 122/82 | HR 78 | Temp 96.9°F | Resp 16 | Ht 64.0 in | Wt 186.0 lb

## 2019-03-22 DIAGNOSIS — K2210 Ulcer of esophagus without bleeding: Secondary | ICD-10-CM

## 2019-03-22 DIAGNOSIS — K5730 Diverticulosis of large intestine without perforation or abscess without bleeding: Secondary | ICD-10-CM | POA: Diagnosis not present

## 2019-03-22 DIAGNOSIS — D123 Benign neoplasm of transverse colon: Secondary | ICD-10-CM | POA: Diagnosis not present

## 2019-03-22 DIAGNOSIS — K2100 Gastro-esophageal reflux disease with esophagitis, without bleeding: Secondary | ICD-10-CM

## 2019-03-22 DIAGNOSIS — D124 Benign neoplasm of descending colon: Secondary | ICD-10-CM | POA: Diagnosis not present

## 2019-03-22 DIAGNOSIS — R195 Other fecal abnormalities: Secondary | ICD-10-CM | POA: Diagnosis present

## 2019-03-22 DIAGNOSIS — K317 Polyp of stomach and duodenum: Secondary | ICD-10-CM | POA: Diagnosis not present

## 2019-03-22 DIAGNOSIS — R110 Nausea: Secondary | ICD-10-CM

## 2019-03-22 DIAGNOSIS — D125 Benign neoplasm of sigmoid colon: Secondary | ICD-10-CM | POA: Diagnosis not present

## 2019-03-22 DIAGNOSIS — K449 Diaphragmatic hernia without obstruction or gangrene: Secondary | ICD-10-CM

## 2019-03-22 MED ORDER — PANTOPRAZOLE SODIUM 40 MG PO TBEC: 40.0000 mg | DELAYED_RELEASE_TABLET | Freq: Every day | ORAL | 3 refills | Status: AC

## 2019-03-22 MED ORDER — SODIUM CHLORIDE 0.9 % IV SOLN: 500.0000 mL | Freq: Once | INTRAVENOUS | Status: DC

## 2019-03-22 NOTE — Progress Notes (Signed)
Pt's states no medical or surgical changes since previsit or office visit.  ADB -temp CW - vitals

## 2019-03-22 NOTE — Op Note (Signed)
[facility] Patient Name: Rachel Stokes Procedure Date: 03/22/2019 2:56 PM MRN: [id number] Endoscopist: Ladene Artist , MD Age: 54 Referring MD:  Date of Birth: [birth year] Gender: Female Account #: [id number] Procedure:                Colonoscopy Indications:              Positive fecal immunochemical test Medicines:                Monitored Anesthesia Care Procedure:                Pre-Anesthesia Assessment:                           - Prior to the procedure, a History and Physical                            was performed, and patient medications and                            allergies were reviewed. The patient's tolerance of                            previous anesthesia was also reviewed. The risks                            and benefits of the procedure and the sedation                            options and risks were discussed with the patient.                            All questions were answered, and informed consent                            was obtained. Prior Anticoagulants: The patient has                            taken no previous anticoagulant or antiplatelet                            agents. ASA Grade Assessment: II - A patient with                            mild systemic disease. After reviewing the risks                            and benefits, the patient was deemed in                            satisfactory condition to undergo the procedure.                           After obtaining informed consent, the colonoscope  was passed under direct vision. Throughout the                            procedure, the patient's blood pressure, pulse, and                            oxygen saturations were monitored continuously. The                            Colonoscope was introduced through the anus and                            advanced to the the cecum, identified by                            appendiceal orifice and  ileocecal valve. The                            ileocecal valve, appendiceal orifice, and rectum                            were photographed. The quality of the bowel                            preparation was adequate. The colonoscopy was                            performed without difficulty. The patient tolerated                            the procedure well. Scope In: 3:06:15 PM Scope Out: 3:20:35 PM Scope Withdrawal Time: 0 hours 12 minutes 28 seconds  Total Procedure Duration: 0 hours 14 minutes 20 seconds  Findings:                 The perianal and digital rectal examinations were                            normal.                           Three sessile polyps were found in the sigmoid                            colon, descending colon and transverse colon. The                            polyps were 6 to 9 mm in size. These polyps were                            removed with a cold snare. Resection and retrieval                            were complete.  A few medium-mouthed diverticula were found in the                            ascending colon. There was no evidence of                            diverticular bleeding.                           Multiple small-mouthed diverticula were found in                            the left colon. There was evidence of diverticular                            spasm. There was no evidence of diverticular                            bleeding.                           The exam was otherwise without abnormality on                            direct and retroflexion views. Complications:            No immediate complications. Estimated blood loss:                            None. Estimated Blood Loss:     Estimated blood loss: none. Impression:               - Three 6 to 9 mm polyps in the sigmoid colon, in                            the descending colon and in the transverse colon,                            removed  with a cold snare. Resected and retrieved.                           - Mild diverticulosis in the ascending colon.                           - Moderate diverticulosis in the left colon.                           - The examination was otherwise normal on direct                            and retroflexion views. Recommendation:           - Repeat colonoscopy date to be determined after                            pending pathology results are reviewed  for                            surveillance based on pathology results.                           - Patient has a contact number available for                            emergencies. The signs and symptoms of potential                            delayed complications were discussed with the                            patient. Return to normal activities tomorrow.                            Written discharge instructions were provided to the                            patient.                           - High fiber diet.                           - Continue present medications.                           - Await pathology results. Ladene Artist, MD 03/22/2019 3:24:34 PM This report has been signed electronically.

## 2019-03-22 NOTE — Progress Notes (Signed)
PT taken to PACU. Monitors in place. VSS. Report given to RN. 

## 2019-03-22 NOTE — Progress Notes (Signed)
Called to room to assist during endoscopic procedure.  Patient ID and intended procedure confirmed with present staff. Received instructions for my participation in the procedure from the performing physician.  

## 2019-03-22 NOTE — Patient Instructions (Signed)
Handouts given for hiatal hernia, GERD, esophagitis, polyps, diverticulosis and high fiber diet.  YOU HAD AN ENDOSCOPIC PROCEDURE TODAY AT [facility]:   Refer to the procedure report that was given to you for any specific questions about what was found during the examination.  If the procedure report does not answer your questions, please call your gastroenterologist to clarify.  If you requested that your care partner not be given the details of your procedure findings, then the procedure report has been included in a sealed envelope for you to review at your convenience later.  YOU SHOULD EXPECT: Some feelings of bloating in the abdomen. Passage of more gas than usual.  Walking can help get rid of the air that was put into your GI tract during the procedure and reduce the bloating. If you had a lower endoscopy (such as a colonoscopy or flexible sigmoidoscopy) you may notice spotting of blood in your stool or on the toilet paper. If you underwent a bowel prep for your procedure, you may not have a normal bowel movement for a few days.  Please Note:  You might notice some irritation and congestion in your nose or some drainage.  This is from the oxygen used during your procedure.  There is no need for concern and it should clear up in a day or so.  SYMPTOMS TO REPORT IMMEDIATELY:   Following lower endoscopy (colonoscopy or flexible sigmoidoscopy):  Excessive amounts of blood in the stool  Significant tenderness or worsening of abdominal pains  Swelling of the abdomen that is new, acute  Fever of 100F or higher   Following upper endoscopy (EGD)  Vomiting of blood or coffee ground material  New chest pain or pain under the shoulder blades  Painful or persistently difficult swallowing  New shortness of breath  Fever of 100F or higher  Black, tarry-looking stools  For urgent or emergent issues, a gastroenterologist can be reached at any hour by calling [phone]. Do  not use MyChart messaging for urgent concerns.    DIET:  We do recommend a small meal at first, but then you may proceed to your regular diet.  Drink plenty of fluids but you should avoid alcoholic beverages for 24 hours.  ACTIVITY:  You should plan to take it easy for the rest of today and you should NOT DRIVE or use heavy machinery until tomorrow (because of the sedation medicines used during the test).    FOLLOW UP: Our staff will call the number listed on your records 48-72 hours following your procedure to check on you and address any questions or concerns that you may have regarding the information given to you following your procedure. If we do not reach you, we will leave a message.  We will attempt to reach you two times.  During this call, we will ask if you have developed any symptoms of COVID 19. If you develop any symptoms (ie: fever, flu-like symptoms, shortness of breath, cough etc.) before then, please call [phone].  If you test positive for Covid 19 in the 2 weeks post procedure, please call and report this information to [place].    If any biopsies were taken you will be contacted by phone or by letter within the next 1-3 weeks.  Please call us at [phone] if you have not heard about the biopsies in 3 weeks.    SIGNATURES/CONFIDENTIALITY: You and/or your care partner have signed paperwork which will be entered into your electronic medical  record.  These signatures attest to the fact that that the information above on your After Visit Summary has been reviewed and is understood.  Full responsibility of the confidentiality of this discharge information lies with you and/or your care-partner. 

## 2019-03-22 NOTE — Op Note (Signed)
[facility] Patient Name: Rachel Stokes Procedure Date: 03/22/2019 2:56 PM MRN: [id number] Endoscopist: Ladene Artist , MD Age: 54 Referring MD:  Date of Birth: 08-11-1965 Gender: Female Account #: [id number] Procedure:                Upper GI endoscopy Indications:              Nausea Medicines:                Monitored Anesthesia Care Procedure:                Pre-Anesthesia Assessment:                           - Prior to the procedure, a History and Physical                            was performed, and patient medications and                            allergies were reviewed. The patient's tolerance of                            previous anesthesia was also reviewed. The risks                            and benefits of the procedure and the sedation                            options and risks were discussed with the patient.                            All questions were answered, and informed consent                            was obtained. Prior Anticoagulants: The patient has                            taken no previous anticoagulant or antiplatelet                            agents. ASA Grade Assessment: II - A patient with                            mild systemic disease. After reviewing the risks                            and benefits, the patient was deemed in                            satisfactory condition to undergo the procedure.                           After obtaining informed consent, the endoscope was  passed under direct vision. Throughout the                            procedure, the patient's blood pressure, pulse, and                            oxygen saturations were monitored continuously. The                            Endoscope was introduced through the mouth, and                            advanced to the second part of duodenum. The upper                            GI endoscopy was accomplished without  difficulty.                            The patient tolerated the procedure well. Scope In: Scope Out: Findings:                 LA Grade A (one or more mucosal breaks less than 5                            mm, not extending between tops of 2 mucosal folds)                            esophagitis with no bleeding was found at the                            gastroesophageal junction.                           The exam of the esophagus was otherwise normal.                           A single 6 mm sessile polyp with no bleeding and no                            stigmata of recent bleeding was found in the                            gastric body. The polyp was removed with a cold                            snare. Resection and retrieval were complete.                           A small hiatal hernia was present.                           The exam of the stomach was otherwise normal.  The duodenal bulb and second portion of the                            duodenum were normal. Complications:            No immediate complications. Estimated Blood Loss:     Estimated blood loss was minimal. Impression:               - LA Grade A reflux esophagitis with no bleeding.                           - A single gastric polyp. Resected and retrieved.                           - Small hiatal hernia.                           - Normal duodenal bulb and second portion of the                            duodenum. Recommendation:           - Patient has a contact number available for                            emergencies. The signs and symptoms of potential                            delayed complications were discussed with the                            patient. Return to normal activities tomorrow.                            Written discharge instructions were provided to the                            patient.                           - Resume previous diet.                            - Antireflux measures.                           - Continue present medications.                           - Await pathology results.                           - Protonix (pantoprazole) 40 mg PO daily, 1 year of                            refills. Ladene Artist, MD 03/22/2019 3:35:05 PM This report has been signed electronically.

## 2019-03-24 ENCOUNTER — Telehealth: Payer: Self-pay

## 2019-03-24 ENCOUNTER — Telehealth: Payer: Self-pay | Admitting: *Deleted

## 2019-03-24 NOTE — Telephone Encounter (Signed)
Left message on follow up call. 

## 2019-03-24 NOTE — Telephone Encounter (Signed)
  Follow up Call-  Call back number 03/22/2019  Post procedure Call Back phone  # [phone]  Permission to leave phone message Yes  Some recent data might be hidden     Patient questions:  Do you have a fever, pain , or abdominal swelling? No. Pain Score  0 *  Have you tolerated food without any problems? Yes.    Have you been able to return to your normal activities? Yes.    Do you have any questions about your discharge instructions: Diet   No. Medications  No. Follow up visit  No.  Do you have questions or concerns about your Care? No.  Actions: * If pain score is 4 or above: No action needed, pain <4.   1. Have you developed a fever since your procedure? no  2.   Have you had an respiratory symptoms (SOB or cough) since your procedure? no  3.   Have you tested positive for COVID 19 since your procedure no  4.   Have you had any family members/close contacts diagnosed with the COVID 19 since your procedure? no   If yes to any of these questions please route to Joylene John, RN and Alphonsa Gin, Therapist, sports.

## 2019-03-31 ENCOUNTER — Encounter: Payer: Self-pay | Admitting: Gastroenterology

## 2020-04-06 ENCOUNTER — Ambulatory Visit (HOSPITAL_BASED_OUTPATIENT_CLINIC_OR_DEPARTMENT_OTHER)
Admission: RE | Admit: 2020-04-06 | Discharge: 2020-04-06 | Disposition: A | Discharge status: Home / Self Care | Payer: BC Managed Care – PPO | Source: Ambulatory Visit | Attending: Family Medicine | Admitting: Family Medicine

## 2020-04-06 ENCOUNTER — Other Ambulatory Visit: Payer: Self-pay

## 2020-04-06 ENCOUNTER — Other Ambulatory Visit (HOSPITAL_COMMUNITY): Payer: Self-pay | Admitting: Family Medicine

## 2020-04-06 ENCOUNTER — Other Ambulatory Visit (HOSPITAL_BASED_OUTPATIENT_CLINIC_OR_DEPARTMENT_OTHER): Payer: Self-pay | Admitting: Family Medicine

## 2020-04-06 DIAGNOSIS — R1013 Epigastric pain: Secondary | ICD-10-CM | POA: Insufficient documentation

## 2020-04-06 DIAGNOSIS — K760 Fatty (change of) liver, not elsewhere classified: Secondary | ICD-10-CM | POA: Diagnosis not present

## 2020-04-06 DIAGNOSIS — R933 Abnormal findings on diagnostic imaging of other parts of digestive tract: Secondary | ICD-10-CM | POA: Insufficient documentation

## 2021-10-21 ENCOUNTER — Other Ambulatory Visit: Payer: Self-pay | Admitting: Internal Medicine

## 2021-10-21 DIAGNOSIS — Z1231 Encounter for screening mammogram for malignant neoplasm of breast: Secondary | ICD-10-CM

## 2021-11-22 ENCOUNTER — Ambulatory Visit
Admission: RE | Admit: 2021-11-22 | Discharge: 2021-11-22 | Disposition: A | Discharge status: Home / Self Care | Source: Ambulatory Visit | Attending: Internal Medicine | Admitting: Internal Medicine

## 2021-11-22 DIAGNOSIS — Z1231 Encounter for screening mammogram for malignant neoplasm of breast: Secondary | ICD-10-CM

## 2022-01-30 ENCOUNTER — Encounter (HOSPITAL_BASED_OUTPATIENT_CLINIC_OR_DEPARTMENT_OTHER): Payer: Self-pay | Admitting: Plastic Surgery

## 2022-01-30 ENCOUNTER — Other Ambulatory Visit: Payer: Self-pay

## 2022-01-30 ENCOUNTER — Encounter (HOSPITAL_BASED_OUTPATIENT_CLINIC_OR_DEPARTMENT_OTHER)
Admission: RE | Admit: 2022-01-30 | Discharge: 2022-01-30 | Disposition: A | Discharge status: Home / Self Care | Source: Ambulatory Visit | Attending: Plastic Surgery | Admitting: Plastic Surgery

## 2022-01-30 DIAGNOSIS — I059 Rheumatic mitral valve disease, unspecified: Secondary | ICD-10-CM | POA: Insufficient documentation

## 2022-01-30 DIAGNOSIS — Z01810 Encounter for preprocedural cardiovascular examination: Secondary | ICD-10-CM | POA: Diagnosis present

## 2022-01-30 NOTE — Progress Notes (Signed)

## 2022-01-30 NOTE — H&P (Signed)
  Subjective:     Patient ID: Rachel Stokes is a 57 y.o. female.   HPI   Returns for follow up discussion prior to planned breast reduction. Current 38 H. Reports several year history neck back and shoulder pain. Also reports diagnosis hiatal hernia and reports weight breast on trunk exacerbating these symptoms. Notes difficulty breathing that is improved with lifting breasts off chest. With regards to pain has tried OTC pain medication, specialty fitted bras, hot/cold packs, and completed PT course for over 3 month trial without effect. Reports numbness hands.   Wt stable   Reports had concerns regarding this surgery as had co worker who developed had reduction post weight loss, developed lymphedema arms post op and eventually had to regain all her weight so that appearance body matched arms. Also reports high tolerance for pain medication and increased requirement for anesthesia, medications with prior surgeries.   MMG- 11/2021.  PA and PGM with history breast ca. Brother passed from pancreatic ca. No history genetic testing in family.   PMH significant for MVP, ADHD on ritalin.   Previous public health professor. Current Therapist, sports. Lives with spouse and 2 kids.    Review of Systems  Eyes: Positive for visual disturbance.  Musculoskeletal: Positive for back pain, myalgias and neck pain.  Allergic/Immunologic: Positive for environmental allergies.  Psychiatric/Behavioral: Positive for sleep disturbance.    Remainder 12 point review negative    Objective:   Physical Exam Cardiovascular:     Rate and Rhythm: Normal rate and regular rhythm.     Heart sounds: Normal heart sounds.  Pulmonary:     Effort: Pulmonary effort is normal.     Breath sounds: Normal breath sounds.  Lymphadenopathy:     Upper Body:     Right upper body: No axillary adenopathy.     Left upper body: No axillary adenopathy.  Skin:    Comments: Fitzpatrick 5     +shoulder grooving Breasts: no  masses, grade 3 ptosis bilateral SN to nipple R 34 L 37 cm BW R 25 L 25 cm Nipple to IMF R 13 L 13 cm Multiple hyperpigmented papules beneath breasts Left axillary lipoma    Assessment:     Macromastia Chronic neck and back pain    Plan:     ASA held for surgery.   Chronic neck and back and shoulder pain in setting of macromastia that has failed conservative management over 3 month trial. The pain is not related to other diagnoses.There is a reasonable likelihood that the patient's symptoms are primarily due to macromastia. Breast reduction is likely to result in improvement of the chronic pain.   Reviewed reduction with anchor type scars, OP surgery, drains, post operative visits and limitations, recovery. Diminished sensation nipple and breast skin, risk of nipple loss, wound healing problems, asymmetry, incidental carcinoma, changes with wt gain/loss, aging, unacceptable cosmetic appearance reviewed. Reviewed scar maturation over months, risks hyper or hypopigmented scar. Reviewed cannot assure cup size. Patient asked about nevus adjacent to left areola that she desires removed. Counseled that this would be in area of planned resection.   Additional risks including but not limited to seroma, bleeding, infection, hematoma, need for additional procedures, fat necrosis, DVT/PE, damage to adjacent structures, cardiopulmonary complications, unacceptable cosmetic result reviewed.    Drain teaching completed. Rx for Norco given.   Anticipate at least 537 g reduction from each breast.

## 2022-02-04 ENCOUNTER — Other Ambulatory Visit: Payer: Self-pay

## 2022-02-04 ENCOUNTER — Ambulatory Visit (HOSPITAL_BASED_OUTPATIENT_CLINIC_OR_DEPARTMENT_OTHER): Admitting: Certified Registered"

## 2022-02-04 ENCOUNTER — Encounter (HOSPITAL_BASED_OUTPATIENT_CLINIC_OR_DEPARTMENT_OTHER): Payer: Self-pay | Admitting: Plastic Surgery

## 2022-02-04 ENCOUNTER — Ambulatory Visit (HOSPITAL_BASED_OUTPATIENT_CLINIC_OR_DEPARTMENT_OTHER)
Admission: RE | Admit: 2022-02-04 | Discharge: 2022-02-04 | Disposition: A | Discharge status: Home / Self Care | Source: Ambulatory Visit | Attending: Plastic Surgery | Admitting: Plastic Surgery

## 2022-02-04 ENCOUNTER — Encounter (HOSPITAL_BASED_OUTPATIENT_CLINIC_OR_DEPARTMENT_OTHER)
Admission: RE | Disposition: A | Discharge status: Home / Self Care | Payer: Self-pay | Source: Ambulatory Visit | Attending: Plastic Surgery

## 2022-02-04 DIAGNOSIS — G8929 Other chronic pain: Secondary | ICD-10-CM | POA: Diagnosis not present

## 2022-02-04 DIAGNOSIS — M549 Dorsalgia, unspecified: Secondary | ICD-10-CM | POA: Diagnosis not present

## 2022-02-04 DIAGNOSIS — M542 Cervicalgia: Secondary | ICD-10-CM | POA: Diagnosis not present

## 2022-02-04 DIAGNOSIS — F909 Attention-deficit hyperactivity disorder, unspecified type: Secondary | ICD-10-CM | POA: Insufficient documentation

## 2022-02-04 DIAGNOSIS — Z87891 Personal history of nicotine dependence: Secondary | ICD-10-CM | POA: Diagnosis not present

## 2022-02-04 DIAGNOSIS — R0600 Dyspnea, unspecified: Secondary | ICD-10-CM | POA: Insufficient documentation

## 2022-02-04 DIAGNOSIS — I059 Rheumatic mitral valve disease, unspecified: Secondary | ICD-10-CM

## 2022-02-04 DIAGNOSIS — N62 Hypertrophy of breast: Secondary | ICD-10-CM

## 2022-02-04 DIAGNOSIS — R200 Anesthesia of skin: Secondary | ICD-10-CM | POA: Diagnosis not present

## 2022-02-04 DIAGNOSIS — Z803 Family history of malignant neoplasm of breast: Secondary | ICD-10-CM | POA: Insufficient documentation

## 2022-02-04 DIAGNOSIS — Z800 Family history of malignant neoplasm of digestive organs: Secondary | ICD-10-CM | POA: Insufficient documentation

## 2022-02-04 DIAGNOSIS — M25519 Pain in unspecified shoulder: Secondary | ICD-10-CM

## 2022-02-04 DIAGNOSIS — K449 Diaphragmatic hernia without obstruction or gangrene: Secondary | ICD-10-CM | POA: Insufficient documentation

## 2022-02-04 DIAGNOSIS — I341 Nonrheumatic mitral (valve) prolapse: Secondary | ICD-10-CM | POA: Insufficient documentation

## 2022-02-04 HISTORY — PX: BREAST REDUCTION SURGERY: SHX8

## 2022-02-04 HISTORY — DX: Family history of other specified conditions: Z84.89

## 2022-02-04 SURGERY — MAMMOPLASTY, REDUCTION
Anesthesia: General | Site: Breast | Laterality: Bilateral

## 2022-02-04 MED ORDER — HYDROMORPHONE HCL 1 MG/ML IJ SOLN
INTRAMUSCULAR | Status: AC
  Filled 2022-02-04: qty 0.5

## 2022-02-04 MED ORDER — MIDAZOLAM HCL 5 MG/5ML IJ SOLN
INTRAMUSCULAR | Status: DC | PRN
  Administered 2022-02-04: 2 mg via INTRAVENOUS

## 2022-02-04 MED ORDER — ONDANSETRON HCL 4 MG/2ML IJ SOLN: 4.0000 mg | Freq: Once | INTRAMUSCULAR | Status: DC | PRN

## 2022-02-04 MED ORDER — ACETAMINOPHEN 500 MG PO TABS
1000.0000 mg | ORAL_TABLET | ORAL | Status: AC
  Administered 2022-02-04: 1000 mg via ORAL

## 2022-02-04 MED ORDER — BUPIVACAINE HCL (PF) 0.5 % IJ SOLN
INTRAMUSCULAR | Status: DC | PRN
  Administered 2022-02-04: 15 mL

## 2022-02-04 MED ORDER — CEFAZOLIN SODIUM-DEXTROSE 2-4 GM/100ML-% IV SOLN
2.0000 g | INTRAVENOUS | Status: AC
  Administered 2022-02-04: 2 g via INTRAVENOUS

## 2022-02-04 MED ORDER — EPHEDRINE SULFATE (PRESSORS) 50 MG/ML IJ SOLN
INTRAMUSCULAR | Status: DC | PRN
  Administered 2022-02-04: 10 mg via INTRAVENOUS
  Administered 2022-02-04: 15 mg via INTRAVENOUS

## 2022-02-04 MED ORDER — MIDAZOLAM HCL 2 MG/2ML IJ SOLN
INTRAMUSCULAR | Status: AC
  Filled 2022-02-04: qty 2

## 2022-02-04 MED ORDER — CELECOXIB 200 MG PO CAPS
ORAL_CAPSULE | ORAL | Status: AC
  Filled 2022-02-04: qty 1

## 2022-02-04 MED ORDER — PROPOFOL 10 MG/ML IV BOLUS
INTRAVENOUS | Status: DC | PRN
  Administered 2022-02-04: 200 mg via INTRAVENOUS

## 2022-02-04 MED ORDER — EPHEDRINE 5 MG/ML INJ
INTRAVENOUS | Status: AC
  Filled 2022-02-04: qty 5

## 2022-02-04 MED ORDER — CELECOXIB 200 MG PO CAPS: 200.0000 mg | ORAL_CAPSULE | ORAL | Status: DC

## 2022-02-04 MED ORDER — ROCURONIUM BROMIDE 10 MG/ML (PF) SYRINGE
PREFILLED_SYRINGE | INTRAVENOUS | Status: AC
  Filled 2022-02-04: qty 10

## 2022-02-04 MED ORDER — HYDROMORPHONE HCL 1 MG/ML IJ SOLN
0.2500 mg | INTRAMUSCULAR | Status: DC | PRN
  Administered 2022-02-04 (×2): 0.5 mg via INTRAVENOUS

## 2022-02-04 MED ORDER — GABAPENTIN 300 MG PO CAPS
ORAL_CAPSULE | ORAL | Status: AC
  Filled 2022-02-04: qty 1

## 2022-02-04 MED ORDER — ACETAMINOPHEN 500 MG PO TABS
ORAL_TABLET | ORAL | Status: AC
  Filled 2022-02-04: qty 2

## 2022-02-04 MED ORDER — DEXAMETHASONE SODIUM PHOSPHATE 4 MG/ML IJ SOLN
INTRAMUSCULAR | Status: DC | PRN
  Administered 2022-02-04: 10 mg via INTRAVENOUS

## 2022-02-04 MED ORDER — GABAPENTIN 300 MG PO CAPS: 300.0000 mg | ORAL_CAPSULE | ORAL | Status: DC

## 2022-02-04 MED ORDER — ONDANSETRON HCL 4 MG/2ML IJ SOLN
INTRAMUSCULAR | Status: DC | PRN
  Administered 2022-02-04: 4 mg via INTRAVENOUS

## 2022-02-04 MED ORDER — DROPERIDOL 2.5 MG/ML IJ SOLN
INTRAMUSCULAR | Status: DC | PRN
  Administered 2022-02-04: .625 mg via INTRAVENOUS

## 2022-02-04 MED ORDER — DEXAMETHASONE SODIUM PHOSPHATE 10 MG/ML IJ SOLN
INTRAMUSCULAR | Status: AC
  Filled 2022-02-04: qty 1

## 2022-02-04 MED ORDER — LACTATED RINGERS IV SOLN: INTRAVENOUS | Status: DC

## 2022-02-04 MED ORDER — DROPERIDOL 2.5 MG/ML IJ SOLN
INTRAMUSCULAR | Status: AC
  Filled 2022-02-04: qty 2

## 2022-02-04 MED ORDER — 0.9 % SODIUM CHLORIDE (POUR BTL) OPTIME
TOPICAL | Status: DC | PRN
  Administered 2022-02-04: 200 mL

## 2022-02-04 MED ORDER — FENTANYL CITRATE (PF) 100 MCG/2ML IJ SOLN
INTRAMUSCULAR | Status: DC | PRN
  Administered 2022-02-04: 100 ug via INTRAVENOUS
  Administered 2022-02-04 (×2): 25 ug via INTRAVENOUS

## 2022-02-04 MED ORDER — LIDOCAINE HCL (CARDIAC) PF 100 MG/5ML IV SOSY
PREFILLED_SYRINGE | INTRAVENOUS | Status: DC | PRN
  Administered 2022-02-04: 100 mg via INTRAVENOUS

## 2022-02-04 MED ORDER — KETOROLAC TROMETHAMINE 30 MG/ML IJ SOLN
INTRAMUSCULAR | Status: AC
  Filled 2022-02-04: qty 1

## 2022-02-04 MED ORDER — KETOROLAC TROMETHAMINE 30 MG/ML IJ SOLN
INTRAMUSCULAR | Status: DC | PRN
  Administered 2022-02-04: 30 mg via INTRAVENOUS

## 2022-02-04 MED ORDER — PHENYLEPHRINE 80 MCG/ML (10ML) SYRINGE FOR IV PUSH (FOR BLOOD PRESSURE SUPPORT)
PREFILLED_SYRINGE | INTRAVENOUS | Status: DC | PRN
  Administered 2022-02-04 (×2): 80 ug via INTRAVENOUS

## 2022-02-04 MED ORDER — SUGAMMADEX SODIUM 200 MG/2ML IV SOLN
INTRAVENOUS | Status: DC | PRN
  Administered 2022-02-04: 200 mg via INTRAVENOUS

## 2022-02-04 MED ORDER — OXYCODONE HCL 5 MG/5ML PO SOLN: 5.0000 mg | Freq: Once | ORAL | Status: AC | PRN

## 2022-02-04 MED ORDER — CEFAZOLIN SODIUM-DEXTROSE 2-4 GM/100ML-% IV SOLN
INTRAVENOUS | Status: AC
  Filled 2022-02-04: qty 100

## 2022-02-04 MED ORDER — OXYCODONE HCL 5 MG PO TABS
5.0000 mg | ORAL_TABLET | Freq: Once | ORAL | Status: AC | PRN
  Administered 2022-02-04: 5 mg via ORAL

## 2022-02-04 MED ORDER — OXYCODONE HCL 5 MG PO TABS
ORAL_TABLET | ORAL | Status: AC
  Filled 2022-02-04: qty 1

## 2022-02-04 MED ORDER — FENTANYL CITRATE (PF) 100 MCG/2ML IJ SOLN
INTRAMUSCULAR | Status: AC
  Filled 2022-02-04: qty 2

## 2022-02-04 MED ORDER — CHLORHEXIDINE GLUCONATE CLOTH 2 % EX PADS: 6.0000 | MEDICATED_PAD | Freq: Once | CUTANEOUS | Status: DC

## 2022-02-04 MED ORDER — PHENYLEPHRINE 80 MCG/ML (10ML) SYRINGE FOR IV PUSH (FOR BLOOD PRESSURE SUPPORT)
PREFILLED_SYRINGE | INTRAVENOUS | Status: AC
  Filled 2022-02-04: qty 10

## 2022-02-04 MED ORDER — BUPIVACAINE HCL (PF) 0.5 % IJ SOLN
INTRAMUSCULAR | Status: AC
  Filled 2022-02-04: qty 30

## 2022-02-04 MED ORDER — ONDANSETRON HCL 4 MG/2ML IJ SOLN
INTRAMUSCULAR | Status: AC
  Filled 2022-02-04: qty 2

## 2022-02-04 MED ORDER — ROCURONIUM BROMIDE 100 MG/10ML IV SOLN
INTRAVENOUS | Status: DC | PRN
  Administered 2022-02-04: 20 mg via INTRAVENOUS
  Administered 2022-02-04: 70 mg via INTRAVENOUS

## 2022-02-04 SURGICAL SUPPLY — 50 items
ADH SKN CLS APL DERMABOND .7 (GAUZE/BANDAGES/DRESSINGS) ×2
APL PRP STRL LF DISP 70% ISPRP (MISCELLANEOUS) ×2
BINDER BREAST 3XL (GAUZE/BANDAGES/DRESSINGS) IMPLANT
BINDER BREAST LRG (GAUZE/BANDAGES/DRESSINGS) IMPLANT
BINDER BREAST MEDIUM (GAUZE/BANDAGES/DRESSINGS) IMPLANT
BINDER BREAST XLRG (GAUZE/BANDAGES/DRESSINGS) IMPLANT
BINDER BREAST XXLRG (GAUZE/BANDAGES/DRESSINGS) IMPLANT
BLADE SURG 10 STRL SS (BLADE) ×4 IMPLANT
BNDG GAUZE DERMACEA FLUFF 4 (GAUZE/BANDAGES/DRESSINGS) ×2 IMPLANT
BNDG GZE DERMACEA 4 6PLY (GAUZE/BANDAGES/DRESSINGS) ×2
CANISTER SUCT 1200ML W/VALVE (MISCELLANEOUS) ×1 IMPLANT
CHLORAPREP W/TINT 26 (MISCELLANEOUS) ×2 IMPLANT
COVER BACK TABLE 60X90IN (DRAPES) ×1 IMPLANT
COVER MAYO STAND STRL (DRAPES) ×1 IMPLANT
DERMABOND ADVANCED .7 DNX12 (GAUZE/BANDAGES/DRESSINGS) ×2 IMPLANT
DRAIN CHANNEL 15F RND FF W/TCR (WOUND CARE) IMPLANT
DRAIN CHANNEL 19F RND (DRAIN) IMPLANT
DRAPE TOP ARMCOVERS (MISCELLANEOUS) ×1 IMPLANT
DRAPE U-SHAPE 76X120 STRL (DRAPES) ×1 IMPLANT
DRAPE UTILITY XL STRL (DRAPES) ×1 IMPLANT
ELECT COATED BLADE 2.86 ST (ELECTRODE) ×1 IMPLANT
ELECT REM PT RETURN 9FT ADLT (ELECTROSURGICAL) ×1
ELECTRODE REM PT RTRN 9FT ADLT (ELECTROSURGICAL) ×1 IMPLANT
EVACUATOR SILICONE 100CC (DRAIN) IMPLANT
GAUZE PAD ABD 8X10 STRL (GAUZE/BANDAGES/DRESSINGS) ×2 IMPLANT
GLOVE BIO SURGEON STRL SZ 6 (GLOVE) ×2 IMPLANT
GOWN STRL REUS W/ TWL LRG LVL3 (GOWN DISPOSABLE) ×2 IMPLANT
GOWN STRL REUS W/TWL LRG LVL3 (GOWN DISPOSABLE) ×2
MARKER SKIN DUAL TIP RULER LAB (MISCELLANEOUS) IMPLANT
NDL HYPO 25X1 1.5 SAFETY (NEEDLE) ×1 IMPLANT
NEEDLE HYPO 25X1 1.5 SAFETY (NEEDLE) ×1 IMPLANT
NS IRRIG 1000ML POUR BTL (IV SOLUTION) ×1 IMPLANT
PACK BASIN DAY SURGERY FS (CUSTOM PROCEDURE TRAY) ×1 IMPLANT
PENCIL SMOKE EVACUATOR (MISCELLANEOUS) ×1 IMPLANT
PIN SAFETY STERILE (MISCELLANEOUS) ×1 IMPLANT
SHEET MEDIUM DRAPE 40X70 STRL (DRAPES) ×2 IMPLANT
SLEEVE SCD COMPRESS KNEE MED (STOCKING) ×1 IMPLANT
SPONGE T-LAP 18X18 ~~LOC~~+RFID (SPONGE) ×3 IMPLANT
STAPLER VISISTAT 35W (STAPLE) ×1 IMPLANT
SUT ETHILON 2 0 FS 18 (SUTURE) IMPLANT
SUT MNCRL AB 4-0 PS2 18 (SUTURE) IMPLANT
SUT VIC AB 3-0 PS1 18 (SUTURE) ×8
SUT VIC AB 3-0 PS1 18XBRD (SUTURE) IMPLANT
SUT VICRYL 4-0 PS2 18IN ABS (SUTURE) IMPLANT
SYR BULB IRRIG 60ML STRL (SYRINGE) ×1 IMPLANT
SYR CONTROL 10ML LL (SYRINGE) ×1 IMPLANT
TOWEL GREEN STERILE FF (TOWEL DISPOSABLE) ×2 IMPLANT
TUBE CONNECTING 20X1/4 (TUBING) ×1 IMPLANT
UNDERPAD 30X36 HEAVY ABSORB (UNDERPADS AND DIAPERS) ×2 IMPLANT
YANKAUER SUCT BULB TIP NO VENT (SUCTIONS) ×1 IMPLANT

## 2022-02-04 NOTE — Discharge Instructions (Addendum)
NO TYLENOL until after 12:30pm. NO IBUPROFEN until after 5:30pm.  Post Anesthesia Home Care Instructions  Activity: Get plenty of rest for the remainder of the day. A responsible individual must stay with you for 24 hours following the procedure.  For the next 24 hours, DO NOT: -Drive a car -Paediatric nurse -Drink alcoholic beverages -Take any medication unless instructed by your physician -Make any legal decisions or sign important papers.  Meals: Start with liquid foods such as gelatin or soup. Progress to regular foods as tolerated. Avoid greasy, spicy, heavy foods. If nausea and/or vomiting occur, drink only clear liquids until the nausea and/or vomiting subsides. Call your physician if vomiting continues.  Special Instructions/Symptoms: Your throat may feel dry or sore from the anesthesia or the breathing tube placed in your throat during surgery. If this causes discomfort, gargle with warm salt water. The discomfort should disappear within 24 hours.  If you had a scopolamine patch placed behind your ear for the management of post- operative nausea and/or vomiting:  1. The medication in the patch is effective for 72 hours, after which it should be removed.  Wrap patch in a tissue and discard in the trash. Wash hands thoroughly with soap and water. 2. You may remove the patch earlier than 72 hours if you experience unpleasant side effects which may include dry mouth, dizziness or visual disturbances. 3. Avoid touching the patch. Wash your hands with soap and water after contact with the patch.    About my Jackson-Pratt Bulb Drain  What is a Jackson-Pratt bulb? A Jackson-Pratt is a soft, round device used to collect drainage. It is connected to a long, thin drainage catheter, which is held in place by one or two small stiches near your surgical incision site. When the bulb is squeezed, it forms a vacuum, forcing the drainage to empty into the bulb.  Emptying the Jackson-Pratt  bulb- To empty the bulb: 1. Release the plug on the top of the bulb. 2. Pour the bulb's contents into a measuring container which your nurse will provide. 3. Record the time emptied and amount of drainage. Empty the drain(s) as often as your     doctor or nurse recommends.  Date                  Time                    Amount (Drain 1)                 Amount (Drain 2)  _____________________________________________________________________  _____________________________________________________________________  _____________________________________________________________________  _____________________________________________________________________  _____________________________________________________________________  _____________________________________________________________________  _____________________________________________________________________  _____________________________________________________________________  Squeezing the Jackson-Pratt Bulb- To squeeze the bulb: 1. Make sure the plug at the top of the bulb is open. 2. Squeeze the bulb tightly in your fist. You will hear air squeezing from the bulb. 3. Replace the plug while the bulb is squeezed. 4. Use a safety pin to attach the bulb to your clothing. This will keep the catheter from     pulling at the bulb insertion site.  When to call your doctor- Call your doctor if: Drain site becomes red, swollen or hot. You have a fever greater than 101 degrees F. There is oozing at the drain site. Drain falls out (apply a guaze bandage over the drain hole and secure it with tape). Drainage increases daily not related to activity patterns. (You will usually have more drainage when you are active than when you  are resting.) Drainage has a bad odor.

## 2022-02-04 NOTE — Anesthesia Procedure Notes (Signed)
Procedure Name: Intubation Date/Time: 02/04/2022 7:56 AM  Performed by: Tawni Millers, CRNAPre-anesthesia Checklist: Patient identified, Emergency Drugs available, Suction available and Patient being monitored Patient Re-evaluated:Patient Re-evaluated prior to induction Oxygen Delivery Method: Circle system utilized Preoxygenation: Pre-oxygenation with 100% oxygen Induction Type: IV induction Ventilation: Mask ventilation without difficulty Laryngoscope Size: Mac and 3 Grade View: Grade I Tube type: Oral Tube size: 7.0 mm Number of attempts: 1 Airway Equipment and Method: Stylet and Oral airway Placement Confirmation: ETT inserted through vocal cords under direct vision, positive ETCO2 and breath sounds checked- equal and bilateral Tube secured with: Tape Dental Injury: Teeth and Oropharynx as per pre-operative assessment

## 2022-02-04 NOTE — Op Note (Signed)
Operative Note   DATE OF OPERATION: 1.24.24  LOCATION: [facility]-outpatient  SURGICAL DIVISION: Plastic Surgery  PREOPERATIVE DIAGNOSES:  1. Macromastia 2. Chronic neck and back pain  POSTOPERATIVE DIAGNOSES:  same  PROCEDURE:  Bilateral breast reduction  SURGEON: Irene Limbo MD MBA  ASSISTANT: none  ANESTHESIA:  General.   EBL: 163 ml  COMPLICATIONS: None immediate.   INDICATIONS FOR PROCEDURE:  The patient, Rachel Stokes, is a 57 y.o. female born on 1965-04-25, is here for treatment chronic neck and back pain in setting of macromastia that has failed conservative measures.   FINDINGS: Right reduction. 632 g Left reduction 670 g  DESCRIPTION OF PROCEDURE:  The patient was marked standing in the preoperative area to mark sternal notch, chest midline, anterior axillary lines, inframammary folds. The location of new nipple areolar complex was marked at level of on inframammary fold on anterior surface breast by palpation. This was marked symmetric over bilateral breasts. With aid of Wise pattern marker, location of new nipple areolar complex and vertical limbs 8 cm) were marked by displacement of breasts along meridian. The patient was taken to the operating room. SCDs were placed and IV antibiotics were given. The patient's operative site was prepped and draped in a sterile fashion. A time out was performed and all information was confirmed to be correct.     I began on left breast. Over left breast, superior pedicle marked and nipple areolar complex incised with 16m diameter marker. Pedicle deepithlialized and developed to chest wall. Breast tissue resected over lower pole. Medial and lateral flaps developed. Additional medial and lateral breast tissue excised. Breast tailor tacked closed.    I then directed attention to right breast where superior pedicle designed. NAC incised with 45 mm diameter marker. The pedicle was deepithelialized. Pedicle developed to chest  wall. Breast tissue resected over lower pole. Medial and lateral flaps developed. Additional medial and lateral breast tissue excised. Breast tailor tacked closed. Patient brought to upright sitting position and assessed for symmetry. Patient returned to supine position. Breast cavities irrigated and hemostasis obtained. Local anesthetic infiltrated throughout each breast. 15 Fr JP placed in each breast and secured with 2-0 nylon. Closure completed bilateral with 3-0 vicryl to approximate dermis along inframammary fold and vertical limb. NAC inset with 4-0 vicryl in dermis. Skin closure completed with 4-0 monocryl subcuticular throughout. Tissue adhesive applied. Dry dressing and breast binder applied.   The patient was allowed to wake from anesthesia, extubated and taken to the recovery room in satisfactory condition.   SPECIMENS: right and left breast reduction  DRAINS: 15 Fr JP in right and left breast  BIrene Limbo MD [facility]  Office/ physician access line after hours [phone]

## 2022-02-04 NOTE — Anesthesia Preprocedure Evaluation (Signed)
Anesthesia Evaluation  Patient identified by MRN, date of birth, ID band Patient awake    Reviewed: Allergy & Precautions, H&P , NPO status , Patient's Chart, lab work & pertinent test results  Airway Mallampati: II  TM Distance: >3 FB Neck ROM: Full    Dental no notable dental hx.    Pulmonary neg pulmonary ROS, former smoker   Pulmonary exam normal breath sounds clear to auscultation       Cardiovascular negative cardio ROS Normal cardiovascular exam Rhythm:Regular Rate:Normal     Neuro/Psych   Anxiety Depression    negative neurological ROS     GI/Hepatic Neg liver ROS,GERD  ,,  Endo/Other  negative endocrine ROS    Renal/GU negative Renal ROS  negative genitourinary   Musculoskeletal negative musculoskeletal ROS (+)    Abdominal   Peds negative pediatric ROS (+)  Hematology negative hematology ROS (+)   Anesthesia Other Findings   Reproductive/Obstetrics negative OB ROS                             Anesthesia Physical Anesthesia Plan  ASA: 2  Anesthesia Plan: General   Post-op Pain Management: Toradol IV (intra-op)*   Induction: Intravenous  PONV Risk Score and Plan: 3 and Ondansetron, Dexamethasone, Droperidol, Midazolam and Treatment may vary due to age or medical condition  Airway Management Planned: Oral ETT  Additional Equipment:   Intra-op Plan:   Post-operative Plan: Extubation in OR  Informed Consent: I have reviewed the patients History and Physical, chart, labs and discussed the procedure including the risks, benefits and alternatives for the proposed anesthesia with the patient or authorized representative who has indicated his/her understanding and acceptance.     Dental advisory given  Plan Discussed with: CRNA and Surgeon  Anesthesia Plan Comments:        Anesthesia Quick Evaluation

## 2022-02-04 NOTE — Transfer of Care (Signed)
Immediate Anesthesia Transfer of Care Note  Patient: Rachel Stokes  Procedure(s) Performed: MAMMARY REDUCTION  (BREAST) (Bilateral: Breast)  Patient Location: PACU  Anesthesia Type:General  Level of Consciousness: awake  Airway & Oxygen Therapy: Patient Spontanous Breathing and Patient connected to face mask oxygen  Post-op Assessment: Report given to RN and Post -op Vital signs reviewed and stable  Post vital signs: Reviewed and stable  Last Vitals:  Vitals Value Taken Time  BP    Temp    Pulse 76 02/04/22 1125  Resp 15 02/04/22 1125  SpO2 100 % 02/04/22 1125  Vitals shown include unvalidated device data.  Last Pain:  Vitals:   02/04/22 0631  TempSrc: Oral  PainSc: 0-No pain      Patients Stated Pain Goal: 3 (53/29/92 4268)  Complications: No notable events documented.

## 2022-02-04 NOTE — Interval H&P Note (Signed)
History and Physical Interval Note:  02/04/2022 7:07 AM  Rachel Stokes  has presented today for surgery, with the diagnosis of macromastia, chronic neck and back pain, shoulder pain.  The various methods of treatment have been discussed with the patient and family. After consideration of risks, benefits and other options for treatment, the patient has consented to  Procedure(s): MAMMARY REDUCTION  (BREAST) (Bilateral) as a surgical intervention.  The patient's history has been reviewed, patient examined, no change in status, stable for surgery.  I have reviewed the patient's chart and labs.  Questions were answered to the patient's satisfaction.     Arnoldo Hooker Iyania Denne

## 2022-02-04 NOTE — Progress Notes (Signed)
Called to see patient in PACU for concern bleeding from left areola incision, firmness. Drains both < 30 ml post op.  On exam, patient comfortable. Bilateral chest with similar feeling to end of surgery, no hematoma. Bilateral drains serosanguinous scant. Left NAC inset one area oozing through dressing and binder. This stopped with pressure and reapplied Dermabond in area. New binder placed.   Irene Limbo, MD [facility]astic [facility] Re[facility] Surgery  Office/ physician access line after hours [phone]

## 2022-02-05 ENCOUNTER — Encounter (HOSPITAL_BASED_OUTPATIENT_CLINIC_OR_DEPARTMENT_OTHER): Payer: Self-pay | Admitting: Plastic Surgery

## 2022-02-05 LAB — SURGICAL PATHOLOGY

## 2022-02-05 NOTE — Anesthesia Postprocedure Evaluation (Signed)
Anesthesia Post Note  Patient: Rachel Stokes  Procedure(s) Performed: MAMMARY REDUCTION  (BREAST) (Bilateral: Breast)     Patient location during evaluation: PACU Anesthesia Type: General Level of consciousness: awake and alert Pain management: pain level controlled Vital Signs Assessment: post-procedure vital signs reviewed and stable Respiratory status: spontaneous breathing, nonlabored ventilation, respiratory function stable and patient connected to nasal cannula oxygen Cardiovascular status: blood pressure returned to baseline and stable Postop Assessment: no apparent nausea or vomiting Anesthetic complications: no  No notable events documented.  Last Vitals:  Vitals:   02/04/22 1200 02/04/22 1225  BP: 128/89 131/82  Pulse: 91 89  Resp: 16 18  Temp:  (!) 36.3 C  SpO2: 98% 97%    Last Pain:  Vitals:   02/04/22 1224  TempSrc:   PainSc: 3                  Kota Ciancio S

## 2022-03-04 ENCOUNTER — Encounter: Payer: Self-pay | Admitting: Gastroenterology

## 2022-06-03 IMAGING — US US ABDOMEN COMPLETE
1 series · 13 of 25 positions shown · non-contrast
Comparison: None.

CLINICAL DATA: Epigastric pain with nausea

EXAM:
ABDOMEN ULTRASOUND COMPLETE

[Series 1: us abdomen complete · 13 of 152 slices shown]
[im 1/152]
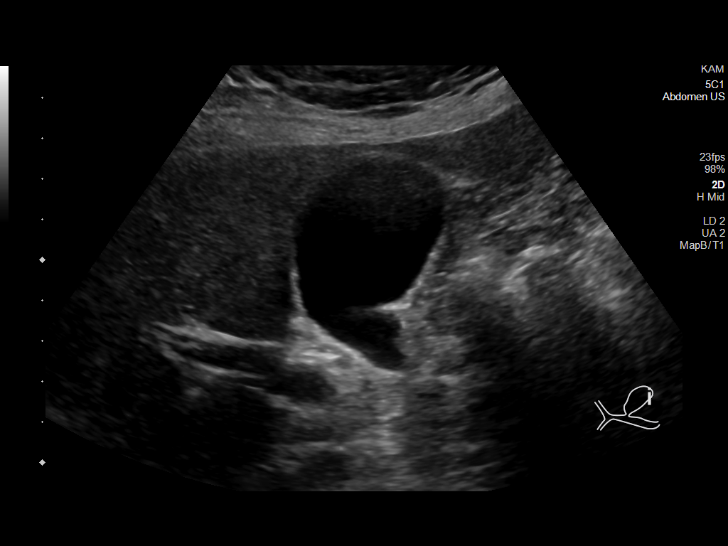
[im 13/152]
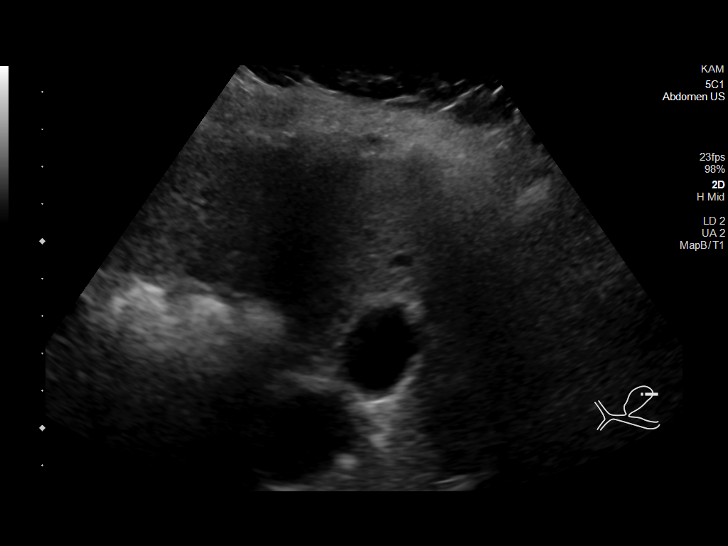
[im 26/152]
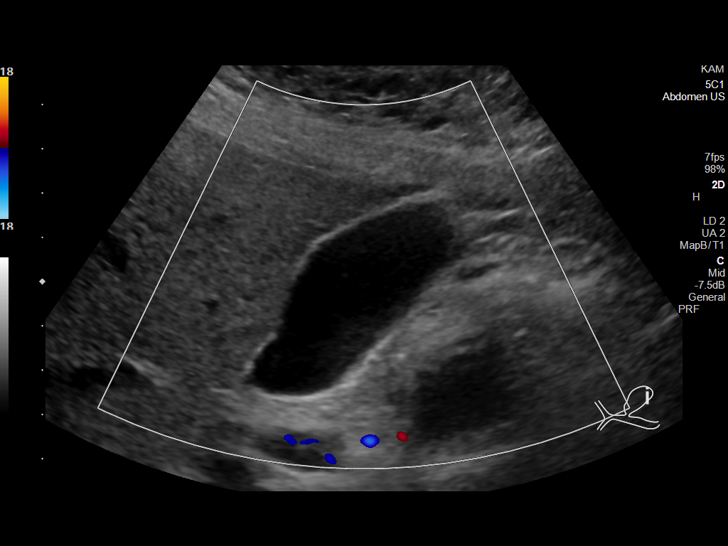
[im 38/152]
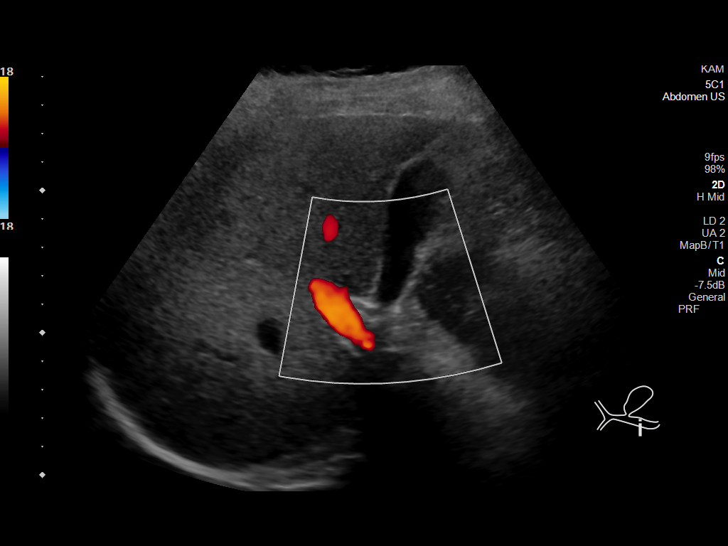
[im 51/152]
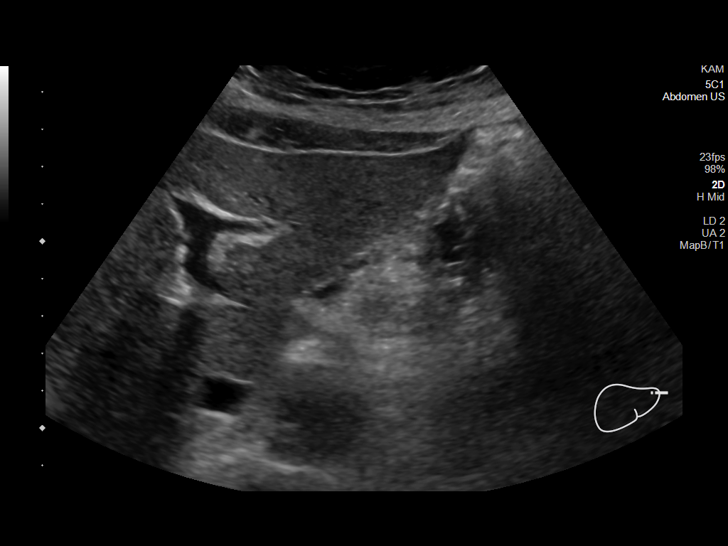
[im 63/152]
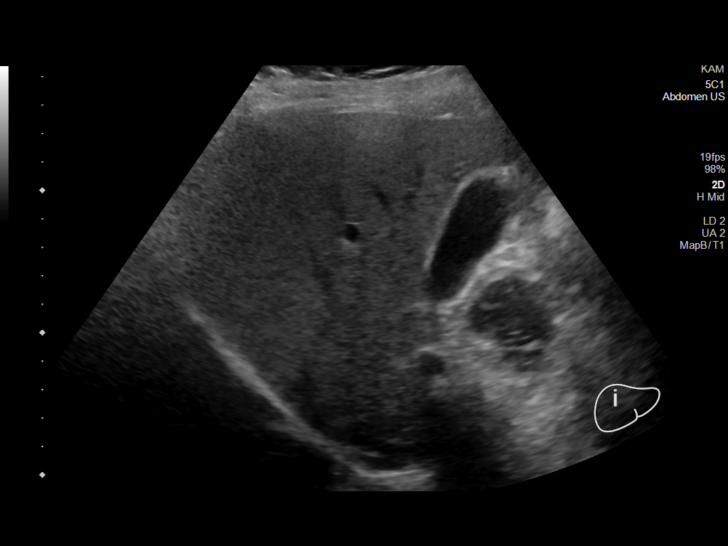
[im 76/152]
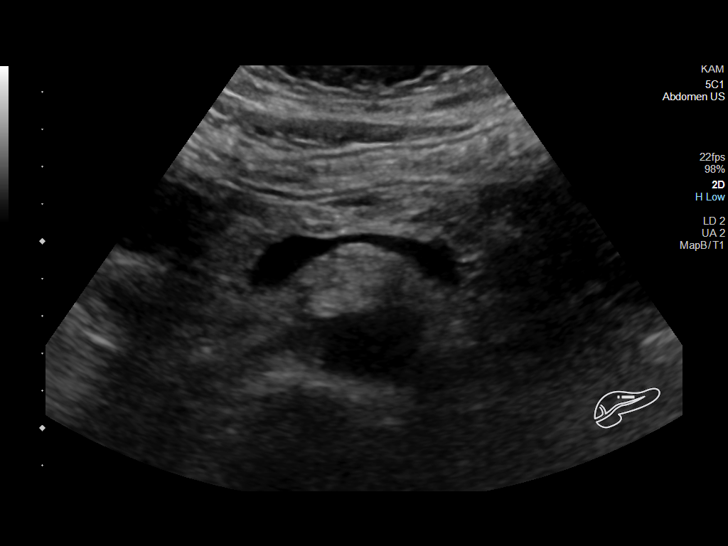
[im 89/152]
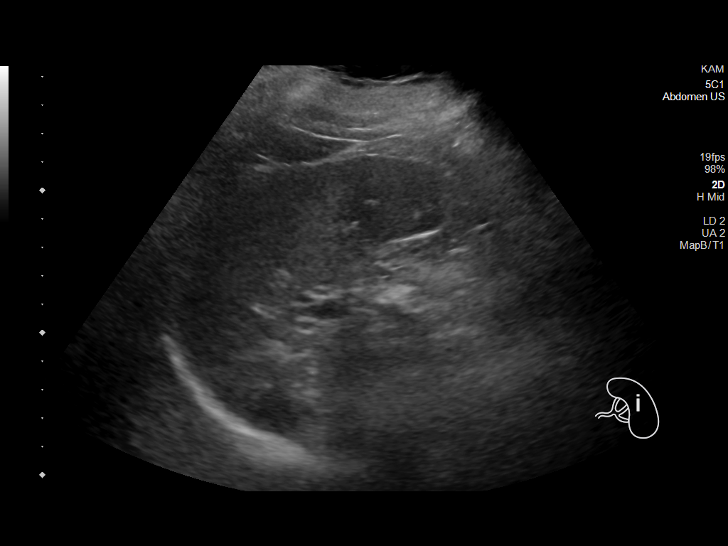
[im 101/152]
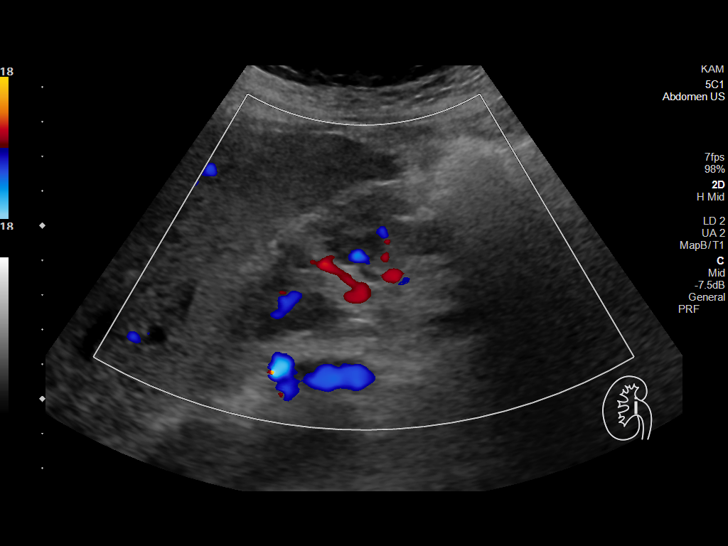
[im 114/152]
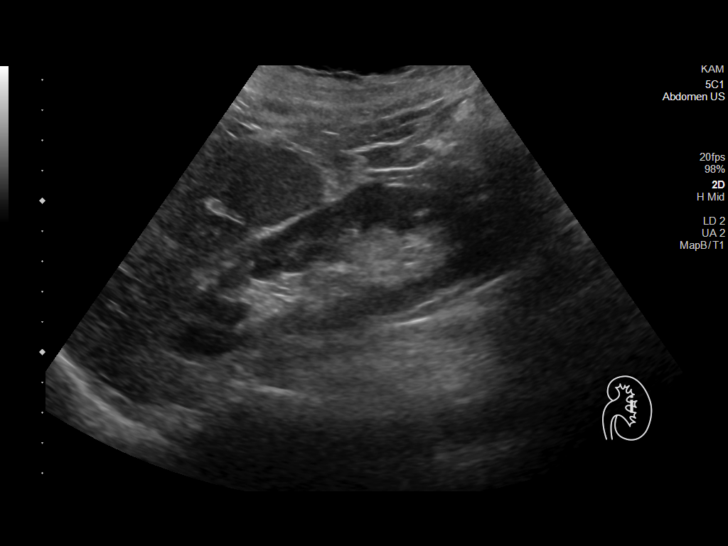
[im 126/152]
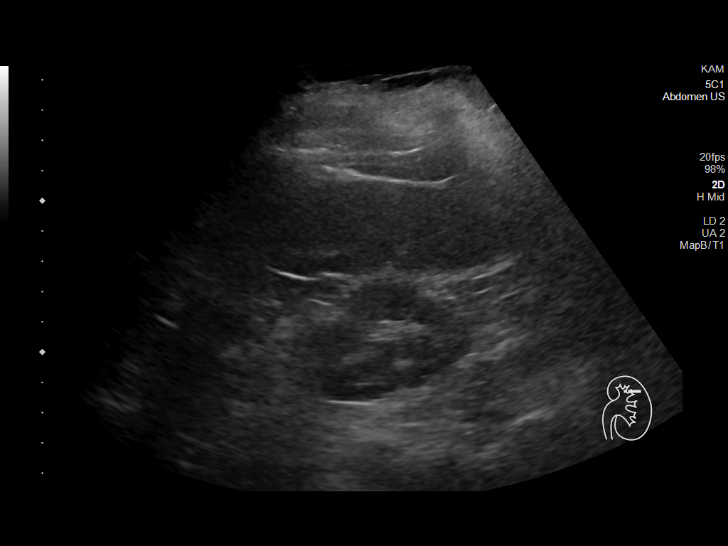
[im 139/152]
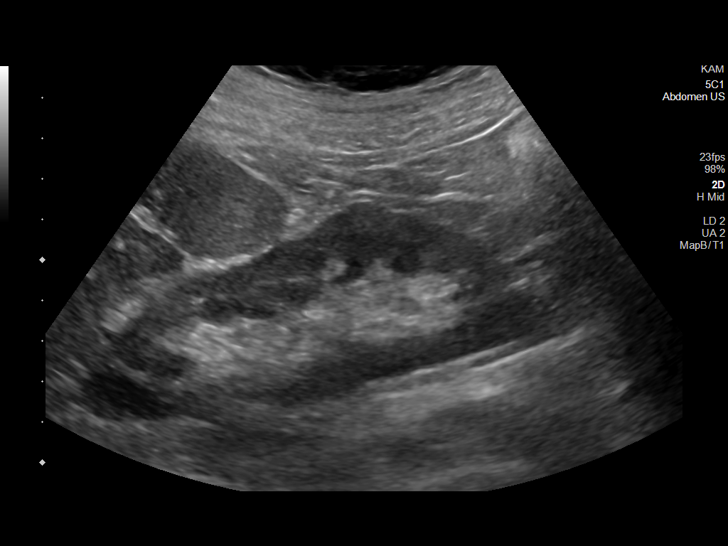
[im 152/152]
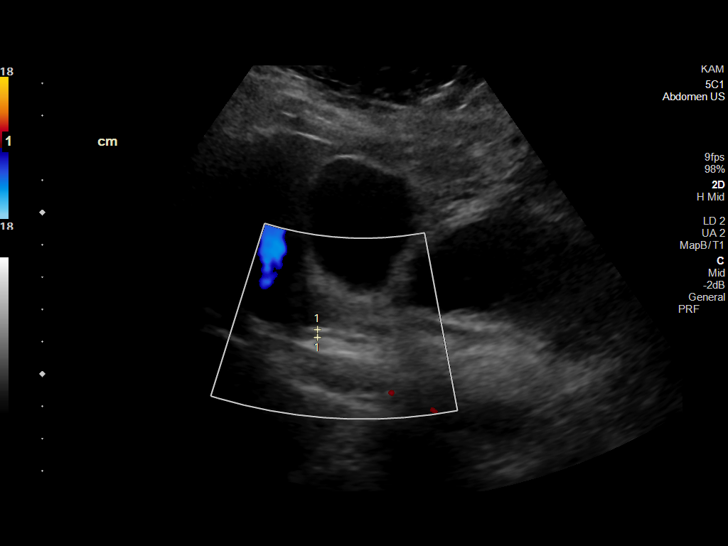

[13 of 25 positions shown; findings below may reference images not displayed]

FINDINGS: Gallbladder: Some gallbladder sludge is present. No shadowing stone.
No Murphy sign, wall thickening or surrounding fluid.

Common bile duct: Diameter: 3 mm common normal.

Liver: Diffusely echogenic liver consistent with steatosis. No focal
lesion. No intrahepatic ductal dilatation. Portal vein is patent on
color Doppler imaging with normal direction of blood flow towards
the liver.

IVC: Poorly visualized because of overlying bowel gas.

Pancreas: Poorly visualized because of overlying bowel gas.

Spleen: Size and appearance within normal limits.

Right Kidney: Length: 9.8 cm. Echogenicity within normal limits. No
mass or hydronephrosis visualized.

Left Kidney: Length: 12.2 cm. Echogenicity within normal limits. No
mass or hydronephrosis visualized.

Abdominal aorta: No aneurysm visualized.

Other findings: None.
IMPRESSION: Large amount of bowel gas hinders the examination and could
contribute to abdominal symptoms.

Echogenic liver consistent with steatosis. No evidence of
gallbladder stones or cholecystitis. Some gallbladder sludge.

Nonvisualization of the pancreas and IVC because of overlying bowel
gas.

## 2023-05-14 DEATH — deceased
